# Patient Record
Sex: Male | Born: 1990 | Race: White | Hispanic: No | Marital: Single | Smoking: Current every day smoker
Health system: Southern US, Community
[De-identification: ages and names within clinical notes are randomized; demographics above are authoritative.]

## PROBLEM LIST (undated history)

## (undated) DIAGNOSIS — A770 Spotted fever due to Rickettsia rickettsii: Secondary | ICD-10-CM

## (undated) DIAGNOSIS — F1990 Other psychoactive substance use, unspecified, uncomplicated: Secondary | ICD-10-CM

## (undated) HISTORY — PX: JOINT REPLACEMENT: SHX530

---
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
## 2008-01-21 ENCOUNTER — Emergency Department (HOSPITAL_COMMUNITY): Admission: EM | Admit: 2008-01-21 | Discharge: 2008-01-21 | Payer: Self-pay | Admitting: *Deleted

## 2008-07-19 ENCOUNTER — Emergency Department (HOSPITAL_COMMUNITY): Admission: EM | Admit: 2008-07-19 | Discharge: 2008-07-19 | Payer: Self-pay | Admitting: Emergency Medicine

## 2009-02-02 ENCOUNTER — Emergency Department (HOSPITAL_COMMUNITY): Admission: EM | Admit: 2009-02-02 | Discharge: 2009-02-03 | Payer: Self-pay | Admitting: Emergency Medicine

## 2009-12-13 ENCOUNTER — Emergency Department (HOSPITAL_COMMUNITY): Admission: EM | Admit: 2009-12-13 | Discharge: 2009-12-13 | Payer: Self-pay | Admitting: Emergency Medicine

## 2010-03-14 ENCOUNTER — Emergency Department (HOSPITAL_COMMUNITY): Admission: EM | Admit: 2010-03-14 | Discharge: 2010-03-14 | Payer: Self-pay | Admitting: Emergency Medicine

## 2010-09-06 ENCOUNTER — Emergency Department (HOSPITAL_COMMUNITY)
Admission: EM | Admit: 2010-09-06 | Discharge: 2010-09-06 | Disposition: A | Discharge status: Home / Self Care | Payer: Self-pay | Attending: Emergency Medicine | Admitting: Emergency Medicine

## 2010-09-06 ENCOUNTER — Emergency Department (HOSPITAL_COMMUNITY): Payer: Self-pay

## 2010-09-06 DIAGNOSIS — M7989 Other specified soft tissue disorders: Secondary | ICD-10-CM | POA: Insufficient documentation

## 2010-09-06 DIAGNOSIS — S62319A Displaced fracture of base of unspecified metacarpal bone, initial encounter for closed fracture: Secondary | ICD-10-CM | POA: Insufficient documentation

## 2010-09-06 DIAGNOSIS — S6990XA Unspecified injury of unspecified wrist, hand and finger(s), initial encounter: Secondary | ICD-10-CM | POA: Insufficient documentation

## 2010-09-06 DIAGNOSIS — IMO0002 Reserved for concepts with insufficient information to code with codable children: Secondary | ICD-10-CM | POA: Insufficient documentation

## 2010-09-06 DIAGNOSIS — W2209XA Striking against other stationary object, initial encounter: Secondary | ICD-10-CM | POA: Insufficient documentation

## 2010-09-06 DIAGNOSIS — M79609 Pain in unspecified limb: Secondary | ICD-10-CM | POA: Insufficient documentation

## 2010-09-06 LAB — CBC
HCT: 43.3 % (ref 39.0–52.0)
MCHC: 35.8 g/dL (ref 30.0–36.0)
RBC: 5.09 MIL/uL (ref 4.22–5.81)
RDW: 12.3 % (ref 11.5–15.5)
WBC: 9 10*3/uL (ref 4.0–10.5)

## 2010-09-06 LAB — BASIC METABOLIC PANEL
BUN: 8 mg/dL (ref 6–23)
Chloride: 106 mEq/L (ref 96–112)
GFR calc Af Amer: >60 mL/min (ref >60)
Sodium: 138 mEq/L (ref 135–145)

## 2010-09-06 LAB — DIFFERENTIAL
Eosinophils Absolute: 0.2 10*3/uL (ref 0.0–0.7)
Eosinophils Relative: 2 % (ref 0–5)
Lymphocytes Relative: 27 % (ref 12–46)
Lymphs Abs: 2.4 10*3/uL (ref 0.7–4.0)
Monocytes Relative: 6 % (ref 3–12)
Neutrophils Relative %: 65 % (ref 43–77)

## 2010-09-12 LAB — HERPES SIMPLEX VIRUS CULTURE

## 2010-09-15 LAB — BASIC METABOLIC PANEL
BUN: 6 mg/dL (ref 6–23)
Calcium: 9.4 mg/dL (ref 8.4–10.5)
Creatinine, Ser: 0.97 mg/dL (ref 0.4–1.5)
Sodium: 140 mEq/L (ref 135–145)

## 2010-09-15 LAB — RAPID URINE DRUG SCREEN, HOSP PERFORMED
Amphetamines: NOT DETECTED
Opiates: NOT DETECTED

## 2010-09-15 LAB — ETHANOL: Alcohol, Ethyl (B): 124 mg/dL — ABNORMAL HIGH (ref 0–10)

## 2010-09-15 LAB — POCT CARDIAC MARKERS
Myoglobin, poc: 134 ng/mL (ref 12–200)
Troponin i, poc: <0.05 ng/mL (ref 0.00–0.09)

## 2010-09-15 LAB — DIFFERENTIAL
Basophils Relative: 1 % (ref 0–1)
Eosinophils Absolute: 0.1 10*3/uL (ref 0.0–0.7)
Eosinophils Relative: 2 % (ref 0–5)
Lymphs Abs: 2.6 10*3/uL (ref 0.7–4.0)
Monocytes Absolute: 0.5 10*3/uL (ref 0.1–1.0)
Monocytes Relative: 7 % (ref 3–12)
Neutro Abs: 3.9 10*3/uL (ref 1.7–7.7)

## 2010-09-15 LAB — CBC
HCT: 45.9 % (ref 39.0–52.0)
MCHC: 34.8 g/dL (ref 30.0–36.0)
MCV: 93.4 fL (ref 78.0–100.0)
Platelets: 202 10*3/uL (ref 150–400)
RBC: 4.91 MIL/uL (ref 4.22–5.81)

## 2010-11-15 NOTE — Op Note (Signed)
NAME:  Gabriel Boyd, Gabriel Boyd                          ACCOUNT NO.:  [id number]   MEDICAL RECORD NO.:  [id number]                   PATIENT TYPE:  EMS   LOCATION:  MINO                                 FACILITY:  [facility]   PHYSICIAN:  Harvie Junior, M.D.                DATE OF BIRTH:  10-28-1990   DATE OF PROCEDURE:  02/11/2002  DATE OF DISCHARGE:  02/11/2002                                 OPERATIVE REPORT   PREOPERATIVE DIAGNOSIS:  Laceration, left Achilles tendon, status post  repair with failure.   POSTOPERATIVE DIAGNOSIS:  Laceration, left Achilles tendon, status post  repair with failure.   PROCEDURES:  1. Irrigation and debridement of Achilles tendon laceration.  2. Repair of Achilles tendon.   SURGEON:  Harvie Junior, M.D.   ASSISTANT:  Currie Paris. Thedore Mins.   ANESTHESIA:  General.   BRIEF HISTORY:  A 20 year old male with a long history of having had a cut  to his Achilles tendon.  He was seen at an outside facility where it was  irrigated, washed, and closed.  He ultimately was walking and heard a big  pop.  He came in for evaluation.  At that time he was noted to have an  obvious discontinuity of his Achilles tendon, and he was brought to the  operating room for evaluation and fixation as needed.   DESCRIPTION OF PROCEDURE:  The patient was brought to the operating room,  where after adequate anesthesia was obtained with a general anesthetic, the  patient was positioned upon the operating table.  He was rolled to the prone  position, and all bony prominences were well-padded.  The left leg was  prepped and draped in the usual sterile fashion.  Following this, routine  examination of the left heel revealed that there was an obvious complete  tear of the Achilles tendon with some mild injury into the calcaneus.  At  this point the portion of the calcaneus which had been injured was  rongeured.  The wound was copiously irrigated with 6 L of normal saline  irrigation.   Then bug juice was instilled into the wound for cleansing.  At  this point the Achilles tendon was identified and repaired primarily.  This  was done with #2 Fibrewire sutures.  An excellent repair of the tendon was  achieved.  At that point the wound was closed loosely after debridement of  all contamination in the wound.  The wound was closed loosely, and a drain  was placed.  The patient was then taken to the recovery room, where he was  noted to be in satisfactory condition in a posterior padded splint with his  toes in a downward position.  Harvie Junior, M.D.    Ranae Plumber  D:  03/31/2002  T:  03/31/2002  Job:  [id number]

## 2010-12-09 ENCOUNTER — Emergency Department (HOSPITAL_COMMUNITY)
Admission: EM | Admit: 2010-12-09 | Discharge: 2010-12-10 | Disposition: A | Discharge status: Other Acute Inpatient Hospital | Payer: Self-pay | Attending: Emergency Medicine | Admitting: Emergency Medicine

## 2010-12-09 DIAGNOSIS — S41109A Unspecified open wound of unspecified upper arm, initial encounter: Secondary | ICD-10-CM | POA: Insufficient documentation

## 2010-12-09 DIAGNOSIS — F329 Major depressive disorder, single episode, unspecified: Secondary | ICD-10-CM | POA: Insufficient documentation

## 2010-12-09 DIAGNOSIS — X789XXA Intentional self-harm by unspecified sharp object, initial encounter: Secondary | ICD-10-CM | POA: Insufficient documentation

## 2010-12-09 DIAGNOSIS — F1010 Alcohol abuse, uncomplicated: Secondary | ICD-10-CM | POA: Insufficient documentation

## 2010-12-09 DIAGNOSIS — F3289 Other specified depressive episodes: Secondary | ICD-10-CM | POA: Insufficient documentation

## 2010-12-09 LAB — POCT I-STAT, CHEM 8
BUN: 9 mg/dL (ref 6–23)
Chloride: 103 mEq/L (ref 96–112)
Creatinine, Ser: 1.5 mg/dL (ref 0.4–1.5)
Hemoglobin: 15.3 g/dL (ref 13.0–17.0)
Potassium: 4.9 mEq/L (ref 3.5–5.1)
TCO2: 21 mmol/L (ref 0–100)

## 2010-12-09 LAB — TYPE AND SCREEN: ABO/RH(D): A POS

## 2010-12-10 ENCOUNTER — Inpatient Hospital Stay (HOSPITAL_COMMUNITY)
Admission: AD | Admit: 2010-12-10 | Discharge: 2010-12-11 | DRG: 897 | Disposition: A | Discharge status: Home / Self Care | Payer: PRIVATE HEALTH INSURANCE | Attending: Psychiatry | Admitting: Psychiatry

## 2010-12-10 DIAGNOSIS — F1994 Other psychoactive substance use, unspecified with psychoactive substance-induced mood disorder: Secondary | ICD-10-CM

## 2010-12-10 DIAGNOSIS — F1410 Cocaine abuse, uncomplicated: Secondary | ICD-10-CM

## 2010-12-10 DIAGNOSIS — F39 Unspecified mood [affective] disorder: Secondary | ICD-10-CM

## 2010-12-10 DIAGNOSIS — F411 Generalized anxiety disorder: Secondary | ICD-10-CM

## 2010-12-10 DIAGNOSIS — Z818 Family history of other mental and behavioral disorders: Secondary | ICD-10-CM

## 2010-12-10 DIAGNOSIS — F1920 Other psychoactive substance dependence, uncomplicated: Secondary | ICD-10-CM

## 2010-12-10 DIAGNOSIS — S41109A Unspecified open wound of unspecified upper arm, initial encounter: Secondary | ICD-10-CM

## 2010-12-10 DIAGNOSIS — X838XXA Intentional self-harm by other specified means, initial encounter: Secondary | ICD-10-CM

## 2010-12-10 DIAGNOSIS — Z560 Unemployment, unspecified: Secondary | ICD-10-CM

## 2010-12-10 DIAGNOSIS — F19982 Other psychoactive substance use, unspecified with psychoactive substance-induced sleep disorder: Secondary | ICD-10-CM

## 2010-12-10 DIAGNOSIS — F1210 Cannabis abuse, uncomplicated: Secondary | ICD-10-CM

## 2010-12-10 DIAGNOSIS — F1010 Alcohol abuse, uncomplicated: Principal | ICD-10-CM

## 2010-12-10 LAB — URINALYSIS, ROUTINE W REFLEX MICROSCOPIC
Hgb urine dipstick: NEGATIVE
Leukocytes, UA: NEGATIVE
Protein, ur: NEGATIVE mg/dL

## 2010-12-10 LAB — RAPID URINE DRUG SCREEN, HOSP PERFORMED
Barbiturates: NOT DETECTED
Tetrahydrocannabinol: POSITIVE — AB

## 2010-12-11 LAB — URINE CULTURE
Colony Count: NO GROWTH
Culture: NO GROWTH

## 2010-12-12 ENCOUNTER — Inpatient Hospital Stay (INDEPENDENT_AMBULATORY_CARE_PROVIDER_SITE_OTHER)
Admission: RE | Admit: 2010-12-12 | Discharge: 2010-12-12 | Disposition: A | Discharge status: Home / Self Care | Payer: PRIVATE HEALTH INSURANCE | Source: Ambulatory Visit | Attending: Family Medicine | Admitting: Family Medicine

## 2010-12-12 DIAGNOSIS — T148XXA Other injury of unspecified body region, initial encounter: Secondary | ICD-10-CM
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
## 2012-02-03 ENCOUNTER — Encounter (HOSPITAL_COMMUNITY): Payer: Self-pay | Admitting: *Deleted

## 2012-02-03 ENCOUNTER — Emergency Department (HOSPITAL_COMMUNITY)
Admission: EM | Admit: 2012-02-03 | Discharge: 2012-02-04 | Disposition: A | Discharge status: Home / Self Care | Payer: Self-pay | Attending: Emergency Medicine | Admitting: Emergency Medicine

## 2012-02-03 DIAGNOSIS — A770 Spotted fever due to Rickettsia rickettsii: Secondary | ICD-10-CM

## 2012-02-03 DIAGNOSIS — F17200 Nicotine dependence, unspecified, uncomplicated: Secondary | ICD-10-CM | POA: Insufficient documentation

## 2012-02-03 DIAGNOSIS — A779 Spotted fever, unspecified: Secondary | ICD-10-CM | POA: Insufficient documentation

## 2012-02-03 DIAGNOSIS — R509 Fever, unspecified: Secondary | ICD-10-CM

## 2012-02-03 MED ORDER — ACETAMINOPHEN 325 MG PO TABS
650.0000 mg | ORAL_TABLET | Freq: Once | ORAL | Status: AC
  Administered 2012-02-03: 650 mg via ORAL
  Filled 2012-02-03: qty 2

## 2012-02-03 MED ORDER — ONDANSETRON HCL 4 MG/2ML IJ SOLN
4.0000 mg | Freq: Once | INTRAMUSCULAR | Status: AC
  Administered 2012-02-04: 4 mg via INTRAVENOUS
  Filled 2012-02-03: qty 2

## 2012-02-03 MED ORDER — SODIUM CHLORIDE 0.9 % IV SOLN
1000.0000 mL | INTRAVENOUS | Status: DC
  Administered 2012-02-04: 1000 mL via INTRAVENOUS

## 2012-02-03 MED ORDER — SODIUM CHLORIDE 0.9 % IV SOLN
1000.0000 mL | Freq: Once | INTRAVENOUS | Status: AC
  Administered 2012-02-04: 1000 mL via INTRAVENOUS

## 2012-02-03 MED ORDER — DOXYCYCLINE HYCLATE 100 MG PO TABS
100.0000 mg | ORAL_TABLET | Freq: Once | ORAL | Status: AC
  Administered 2012-02-04: 100 mg via ORAL
  Filled 2012-02-03: qty 1

## 2012-02-03 NOTE — ED Notes (Signed)
Pt states he will not allow NT to recheck vitals unless it will get pt back to room faster. Refused vital check when pt was informed on wait.

## 2012-02-03 NOTE — ED Notes (Signed)
To ED for eval of decreased appetite, fever, tingling in legs for the past week. Pt took otc meds at home without relief. Also complains of a migraine

## 2012-02-03 NOTE — ED Notes (Signed)
Pt girlfriend states that pt has had problems with his teeth and she thinks that he may have an infection in his teeth as well

## 2012-02-04 ENCOUNTER — Emergency Department (HOSPITAL_COMMUNITY): Payer: Self-pay

## 2012-02-04 LAB — URINALYSIS, ROUTINE W REFLEX MICROSCOPIC
Bilirubin Urine: NEGATIVE
Hgb urine dipstick: NEGATIVE
Ketones, ur: NEGATIVE mg/dL
Protein, ur: NEGATIVE mg/dL
Specific Gravity, Urine: 1.015 (ref 1.005–1.030)
Urobilinogen, UA: 0.2 mg/dL (ref 0.0–1.0)

## 2012-02-04 LAB — CBC WITH DIFFERENTIAL/PLATELET
Basophils Absolute: 0 10*3/uL (ref 0.0–0.1)
HCT: 38.2 % — ABNORMAL LOW (ref 39.0–52.0)
Lymphocytes Relative: 19 % (ref 12–46)
Lymphs Abs: 1.3 10*3/uL (ref 0.7–4.0)
Neutro Abs: 4.9 10*3/uL (ref 1.7–7.7)
Platelets: 183 10*3/uL (ref 150–400)
RBC: 4.4 MIL/uL (ref 4.22–5.81)
RDW: 12.6 % (ref 11.5–15.5)
WBC: 7 10*3/uL (ref 4.0–10.5)

## 2012-02-04 LAB — COMPREHENSIVE METABOLIC PANEL
ALT: 76 U/L — ABNORMAL HIGH (ref 0–53)
AST: 28 U/L (ref 0–37)
Alkaline Phosphatase: 125 U/L — ABNORMAL HIGH (ref 39–117)
CO2: 27 mEq/L (ref 19–32)
Chloride: 97 mEq/L (ref 96–112)
GFR calc non Af Amer: >90 mL/min (ref >90)
Sodium: 134 mEq/L — ABNORMAL LOW (ref 135–145)
Total Bilirubin: 0.6 mg/dL (ref 0.3–1.2)

## 2012-02-04 LAB — URINE CULTURE

## 2012-02-04 MED ORDER — OXYCODONE-ACETAMINOPHEN 5-325 MG PO TABS
2.0000 | ORAL_TABLET | Freq: Once | ORAL | Status: AC
  Administered 2012-02-04: 2 via ORAL
  Filled 2012-02-04: qty 2

## 2012-02-04 MED ORDER — OXYCODONE-ACETAMINOPHEN 5-325 MG PO TABS: 2.0000 | ORAL_TABLET | ORAL | Status: AC | PRN

## 2012-02-04 MED ORDER — ONDANSETRON HCL 4 MG PO TABS: 4.0000 mg | ORAL_TABLET | Freq: Four times a day (QID) | ORAL | Status: AC

## 2012-02-04 MED ORDER — IBUPROFEN 800 MG PO TABS: 800.0000 mg | ORAL_TABLET | Freq: Three times a day (TID) | ORAL | Status: AC

## 2012-02-04 MED ORDER — DOXYCYCLINE HYCLATE 100 MG PO CAPS: 100.0000 mg | ORAL_CAPSULE | Freq: Two times a day (BID) | ORAL | Status: AC

## 2012-02-04 MED ORDER — MORPHINE SULFATE 4 MG/ML IJ SOLN
4.0000 mg | Freq: Once | INTRAMUSCULAR | Status: AC
  Administered 2012-02-04: 4 mg via INTRAVENOUS
  Filled 2012-02-04: qty 1

## 2012-02-04 MED ORDER — KETOROLAC TROMETHAMINE 30 MG/ML IJ SOLN
30.0000 mg | Freq: Once | INTRAMUSCULAR | Status: AC
  Administered 2012-02-04: 30 mg via INTRAVENOUS
  Filled 2012-02-04: qty 1

## 2012-02-04 NOTE — ED Notes (Signed)
Transported to xray dept via stretcher per xray tech  

## 2012-02-04 NOTE — ED Provider Notes (Signed)
History     CSN: [id number]  Arrival date & time 02/03/12  1821   First MD Initiated Contact with Patient 02/03/12 2303      Chief Complaint  Patient presents with  . Fever  . Anorexia    (Consider location/radiation/quality/duration/timing/severity/associated sxs/prior treatment) HPI 21 year old male presents to emergency department with complaint of headache, fever, rash, anorexia, occasional vomiting and overall malaise. Patient reports symptoms have been ongoing for about a week. MAXIMUM TEMPERATURE has been 103.3. Patient developed rash this past Sunday. Patient thought it was due to his "bad teeth". He denies any tick bites, [place]. He denies any sick contacts, immunizations are up-to-date. He has had no diarrhea no sore throat no ear pain. He has had occasional photophobia. He denies any neck stiffness or tenderness.  History reviewed. No pertinent past medical history.  History reviewed. No pertinent past surgical history.  History reviewed. No pertinent family history.  History  Substance Use Topics  . Smoking status: Current Everyday Smoker    Types: Cigarettes  . Smokeless tobacco: Not on file  . Alcohol Use: Yes     sometimes      Review of Systems  All other systems reviewed and are negative.    Allergies  Review of patient's allergies indicates no known allergies.  Home Medications   Current Outpatient Rx  Name Route Sig Dispense Refill  . IBUPROFEN 200 MG PO TABS Oral Take 800 mg by mouth every 6 (six) hours as needed. For pain    . NAPROXEN SODIUM 220 MG PO TABS Oral Take 440 mg by mouth 2 (two) times daily as needed. For pain    . DOXYCYCLINE HYCLATE 100 MG PO CAPS Oral Take 1 capsule (100 mg total) by mouth 2 (two) times daily. 28 capsule 0  . IBUPROFEN 800 MG PO TABS Oral Take 1 tablet (800 mg total) by mouth 3 (three) times daily. 21 tablet 0  . ONDANSETRON HCL 4 MG PO TABS Oral Take 1 tablet (4 mg total) by mouth every  6 (six) hours. 12 tablet 0  . OXYCODONE-ACETAMINOPHEN 5-325 MG PO TABS Oral Take 2 tablets by mouth every 4 (four) hours as needed for pain. 20 tablet 0    BP 122/67  Pulse 46  Temp 99.2 F (37.3 C) (Oral)  Resp 18  SpO2 96%  Physical Exam  Nursing note and vitals reviewed. Constitutional: He is oriented to person, place, and time. He appears well-developed and well-nourished. No distress.  HENT:  Head: Normocephalic and atraumatic.  Right Ear: External ear normal.  Left Ear: External ear normal.  Nose: Nose normal.  Mouth/Throat: Oropharynx is clear and moist.  Eyes: EOM are normal. Pupils are equal, round, and reactive to light. No scleral icterus.       Patient with conjunctival injection without exudate  Neck: Normal range of motion. Neck supple. No JVD present. No tracheal deviation present. No thyromegaly present.       No nuchal rigidity normal range of motion  Cardiovascular: Normal rate, regular rhythm, normal heart sounds and intact distal pulses.  Exam reveals no gallop and no friction rub.   No murmur heard. Pulmonary/Chest: Effort normal and breath sounds normal. No stridor. No respiratory distress. He has no wheezes. He has no rales. He exhibits no tenderness.  Abdominal: Soft. Bowel sounds are normal. He exhibits no distension and no mass. There is no tenderness. There is no rebound and no guarding.  Musculoskeletal: Normal range  of motion. He exhibits tenderness (tender over areas of rash). He exhibits no edema.  Lymphadenopathy:    He has no cervical adenopathy.  Neurological: He is alert and oriented to person, place, and time. He has normal reflexes. He displays normal reflexes. No cranial nerve deficit. He exhibits normal muscle tone. Coordination normal.  Skin: Skin is warm and dry. Rash (diffuse erythematous patchy rash over legs, arms, trunk. No rash on palms worse. The. Rash blanches when pressed, rashes tender to palpation) noted. He is not diaphoretic. No  erythema. No pallor.  Psychiatric: He has a normal mood and affect. His behavior is normal. Judgment and thought content normal.    ED Course  Procedures (including critical care time)  Labs Reviewed  CBC WITH DIFFERENTIAL - Abnormal; Notable for the following:    HCT 38.2 (*)     All other components within normal limits  COMPREHENSIVE METABOLIC PANEL - Abnormal; Notable for the following:    Sodium 134 (*)     Potassium 3.4 (*)     Glucose, Bld 135 (*)     Albumin 3.1 (*)     ALT 76 (*)     Alkaline Phosphatase 125 (*)     All other components within normal limits  URINALYSIS, ROUTINE W REFLEX MICROSCOPIC  ROCKY MTN SPOTTED FVR AB, IGM-BLOOD  ROCKY MTN SPOTTED FVR AB, IGG-BLOOD  CULTURE, BLOOD (ROUTINE X 2)  CULTURE, BLOOD (ROUTINE X 2)  URINE CULTURE   Dg Chest 2 View  02/04/2012  *RADIOLOGY REPORT*  Clinical Data: Fever, headache, nausea  CHEST - 2 VIEW  Comparison: 12/13/2009  Findings: Mild central bronchitic changes.  No focal pneumonia, edema, collapse, consolidation, effusion or pneumothorax.  Trachea midline.  Normal heart size and vascularity.  No osseous abnormality.  IMPRESSION: Mild central bronchitic changes.  No focal pneumonia.  Original Report Authenticated By: Judie Petit. Ruel Favors, M.D.     1. Fever   2. [place] spotted fever       MDM  21 year old male with symptoms suspicious of [place] spotted fever. He does not remember at tick bite, however he may of been bitten by a early stage tick and not known. Patient with slight elevation in transaminases, slightly low platelets. Will start on doxycycline, send titers for [facility] spotted fever. Patient to return to the ER if not improving. Precautions given to patient and his girlfriend for reasons for return.        Olivia Mackie, MD 02/04/12 [phone]

## 2012-02-04 NOTE — ED Notes (Signed)
Pt has had water times 2 cups earlier and kept it down and I gave patient another cup of ice water.

## 2012-02-04 NOTE — ED Notes (Signed)
PT has had headache, vomiting, chills, and fever for one week.  PT is resting to the side and states that headache has improved from 10/10 to 7/10.  No abdominal pain

## 2012-02-05 LAB — ROCKY MTN SPOTTED FVR AB, IGG-BLOOD: RMSF IgG: 0.11 IV

## 2012-02-10 LAB — CULTURE, BLOOD (ROUTINE X 2): Culture: NO GROWTH

## 2012-03-21 ENCOUNTER — Emergency Department (HOSPITAL_COMMUNITY): Payer: Self-pay

## 2012-03-21 ENCOUNTER — Emergency Department (HOSPITAL_COMMUNITY)
Admission: EM | Admit: 2012-03-21 | Discharge: 2012-03-21 | Disposition: A | Discharge status: Home / Self Care | Payer: Self-pay | Attending: Emergency Medicine | Admitting: Emergency Medicine

## 2012-03-21 ENCOUNTER — Encounter (HOSPITAL_COMMUNITY): Payer: Self-pay | Admitting: *Deleted

## 2012-03-21 DIAGNOSIS — S60229A Contusion of unspecified hand, initial encounter: Secondary | ICD-10-CM | POA: Insufficient documentation

## 2012-03-21 DIAGNOSIS — S61409A Unspecified open wound of unspecified hand, initial encounter: Secondary | ICD-10-CM | POA: Insufficient documentation

## 2012-03-21 DIAGNOSIS — F17200 Nicotine dependence, unspecified, uncomplicated: Secondary | ICD-10-CM | POA: Insufficient documentation

## 2012-03-21 DIAGNOSIS — S61459A Open bite of unspecified hand, initial encounter: Secondary | ICD-10-CM

## 2012-03-21 MED ORDER — TRAMADOL HCL 50 MG PO TABS: 50.0000 mg | ORAL_TABLET | Freq: Four times a day (QID) | ORAL | Status: DC | PRN

## 2012-03-21 MED ORDER — AMOXICILLIN-POT CLAVULANATE 875-125 MG PO TABS: 1.0000 | ORAL_TABLET | Freq: Two times a day (BID) | ORAL | Status: DC

## 2012-03-21 MED ORDER — AMOXICILLIN-POT CLAVULANATE 875-125 MG PO TABS
1.0000 | ORAL_TABLET | Freq: Once | ORAL | Status: AC
  Administered 2012-03-21: 1 via ORAL
  Filled 2012-03-21: qty 1

## 2012-03-21 NOTE — ED Notes (Signed)
Pt states "he wasn't coming back to a triage room if he wasn't getting a cast put on", pt refused recheck of VS at this time and remains in waiting room, ambulatory.
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
## 2012-03-21 NOTE — ED Notes (Signed)
Pt refused discharged instructions and prescriptions as well as irrigation to the hand wound.

## 2012-03-21 NOTE — ED Notes (Signed)
Pt aware of delay with applying splint, made aware Ortho will be called to come in to apply splint. Pt elects to leave without splint "since it is not broken"

## 2012-03-21 NOTE — ED Notes (Signed)
Pt refused vital signs to be taken.

## 2013-03-09 ENCOUNTER — Encounter (HOSPITAL_COMMUNITY): Payer: Self-pay | Admitting: Emergency Medicine

## 2013-03-09 ENCOUNTER — Observation Stay (HOSPITAL_COMMUNITY)
Admission: EM | Admit: 2013-03-09 | Discharge: 2013-03-09 | Discharge status: Against Medical Advice | Payer: PRIVATE HEALTH INSURANCE | Attending: Emergency Medicine | Admitting: Emergency Medicine

## 2013-03-09 ENCOUNTER — Emergency Department (HOSPITAL_COMMUNITY): Payer: PRIVATE HEALTH INSURANCE

## 2013-03-09 DIAGNOSIS — L02419 Cutaneous abscess of limb, unspecified: Secondary | ICD-10-CM

## 2013-03-09 DIAGNOSIS — F1410 Cocaine abuse, uncomplicated: Secondary | ICD-10-CM | POA: Insufficient documentation

## 2013-03-09 DIAGNOSIS — F1910 Other psychoactive substance abuse, uncomplicated: Secondary | ICD-10-CM

## 2013-03-09 DIAGNOSIS — IMO0002 Reserved for concepts with insufficient information to code with codable children: Principal | ICD-10-CM | POA: Insufficient documentation

## 2013-03-09 HISTORY — DX: Other psychoactive substance use, unspecified, uncomplicated: F19.90

## 2013-03-09 HISTORY — DX: Spotted fever due to Rickettsia rickettsii: A77.0

## 2013-03-09 LAB — CBC WITH DIFFERENTIAL/PLATELET
Basophils Absolute: 0 10*3/uL (ref 0.0–0.1)
HCT: 47.2 % (ref 39.0–52.0)
Lymphocytes Relative: 33 % (ref 12–46)
Monocytes Absolute: 0.6 10*3/uL (ref 0.1–1.0)
Neutro Abs: 5.5 10*3/uL (ref 1.7–7.7)
Platelets: 231 10*3/uL (ref 150–400)
RDW: 12.7 % (ref 11.5–15.5)
WBC: 9.5 10*3/uL (ref 4.0–10.5)

## 2013-03-09 LAB — POCT I-STAT, CHEM 8
Calcium, Ion: 1.2 mmol/L (ref 1.12–1.23)
Chloride: 102 mEq/L (ref 96–112)
Creatinine, Ser: 1.3 mg/dL (ref 0.50–1.35)
Glucose, Bld: 81 mg/dL (ref 70–99)
HCT: 51 % (ref 39.0–52.0)
Potassium: 3.3 mEq/L — ABNORMAL LOW (ref 3.5–5.1)

## 2013-03-09 MED ORDER — SULFAMETHOXAZOLE-TMP DS 800-160 MG PO TABS: 1.0000 | ORAL_TABLET | Freq: Two times a day (BID) | ORAL | Status: DC

## 2013-03-09 MED ORDER — VANCOMYCIN HCL IN DEXTROSE 1-5 GM/200ML-% IV SOLN
1000.0000 mg | Freq: Once | INTRAVENOUS | Status: AC
  Administered 2013-03-09: 1000 mg via INTRAVENOUS
  Filled 2013-03-09: qty 200

## 2013-03-09 MED ORDER — IOHEXOL 300 MG/ML  SOLN
80.0000 mL | Freq: Once | INTRAMUSCULAR | Status: AC | PRN
  Administered 2013-03-09: 80 mL via INTRAVENOUS

## 2013-03-09 MED ORDER — SODIUM CHLORIDE 0.9 % IV SOLN
Freq: Once | INTRAVENOUS | Status: AC
  Administered 2013-03-09: 03:00:00 via INTRAVENOUS
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
## 2013-03-09 NOTE — ED Notes (Signed)
Pt has decided to leave AMA, pt did not want to be admitted. Pt aware of risks of leaving AMA. Pt agreed to stay while his antibiotic finishes. Bed request and nurse assigned to receive pt informed.

## 2013-03-09 NOTE — ED Provider Notes (Signed)
Medical screening examination/treatment/procedure(s) were performed by non-physician practitioner and as supervising physician I was immediately available for consultation/collaboration.  Shamera Yarberry M Evolett Somarriba, MD 03/09/13 0459 
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
## 2013-03-09 NOTE — ED Notes (Signed)
Pt c/o abscess at right ac.  St's he used IV drugs 4 days ago and area started swelling with pain 2 days ago.

## 2013-03-09 NOTE — ED Notes (Signed)
Pt reported right arm burning at infusion site, pt assessed, pt's arm red. Vancomycin infusion stopped, NS fluids running wide open. Pharmacy called, pharmacy stated they did not believe it was an allergic reaction rather an infusion related issue. NP informed, pt's infusion rate changed to 100 ml/hr.

## 2013-03-11 LAB — WOUND CULTURE: Special Requests: NORMAL

## 2013-03-15 LAB — CULTURE, BLOOD (ROUTINE X 2): Culture: NO GROWTH

## 2013-03-15 NOTE — ED Provider Notes (Signed)
Medical screening examination/treatment/procedure(s) were performed by non-physician practitioner and as supervising physician I was immediately available for consultation/collaboration.  Analilia Geddis M Aquarius Latouche, MD 03/15/13 1027 

## 2013-08-07 ENCOUNTER — Encounter (HOSPITAL_BASED_OUTPATIENT_CLINIC_OR_DEPARTMENT_OTHER): Payer: Self-pay | Admitting: Emergency Medicine

## 2013-08-07 ENCOUNTER — Emergency Department (HOSPITAL_BASED_OUTPATIENT_CLINIC_OR_DEPARTMENT_OTHER): Payer: Self-pay

## 2013-08-07 DIAGNOSIS — S0003XA Contusion of scalp, initial encounter: Secondary | ICD-10-CM | POA: Insufficient documentation

## 2013-08-07 DIAGNOSIS — Z8619 Personal history of other infectious and parasitic diseases: Secondary | ICD-10-CM | POA: Insufficient documentation

## 2013-08-07 DIAGNOSIS — S0083XA Contusion of other part of head, initial encounter: Principal | ICD-10-CM

## 2013-08-07 DIAGNOSIS — S60229A Contusion of unspecified hand, initial encounter: Secondary | ICD-10-CM | POA: Insufficient documentation

## 2013-08-07 DIAGNOSIS — S1093XA Contusion of unspecified part of neck, initial encounter: Principal | ICD-10-CM

## 2013-08-07 DIAGNOSIS — Y9389 Activity, other specified: Secondary | ICD-10-CM | POA: Insufficient documentation

## 2013-08-07 DIAGNOSIS — F17200 Nicotine dependence, unspecified, uncomplicated: Secondary | ICD-10-CM | POA: Insufficient documentation

## 2013-08-07 DIAGNOSIS — Y9229 Other specified public building as the place of occurrence of the external cause: Secondary | ICD-10-CM | POA: Insufficient documentation
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
## 2013-08-08 ENCOUNTER — Emergency Department (HOSPITAL_BASED_OUTPATIENT_CLINIC_OR_DEPARTMENT_OTHER): Payer: Self-pay

## 2013-08-08 ENCOUNTER — Emergency Department (HOSPITAL_BASED_OUTPATIENT_CLINIC_OR_DEPARTMENT_OTHER)
Admission: EM | Admit: 2013-08-08 | Discharge: 2013-08-08 | Disposition: A | Discharge status: Home / Self Care | Payer: PRIVATE HEALTH INSURANCE | Attending: Emergency Medicine | Admitting: Emergency Medicine

## 2013-08-08 DIAGNOSIS — S0033XA Contusion of nose, initial encounter: Secondary | ICD-10-CM

## 2013-08-08 DIAGNOSIS — S60221A Contusion of right hand, initial encounter: Secondary | ICD-10-CM

## 2013-08-08 MED ORDER — HYDROCODONE-ACETAMINOPHEN 5-325 MG PO TABS: 2.0000 | ORAL_TABLET | ORAL | Status: DC | PRN

## 2013-08-08 MED ORDER — NAPROXEN 500 MG PO TABS: 500.0000 mg | ORAL_TABLET | Freq: Two times a day (BID) | ORAL | Status: DC

## 2013-08-08 NOTE — ED Provider Notes (Signed)
Medical screening examination/treatment/procedure(s) were performed by non-physician practitioner and as supervising physician I was immediately available for consultation/collaboration.      Klaira Pesci L Abhinav Mayorquin, MD 08/08/13 0427 
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
## 2013-08-08 NOTE — Discharge Instructions (Signed)
Your x-ray show no broken bones in your hand or your nose. You will need to apply ice to the areas, elevate your hand and wear the ace wrap for comfort. Follow up with your doctor or return here as needed for problems. Return immediately for worsening symptoms such as numbness of the hand, change in color or other problems.

## 2014-07-20 IMAGING — CR DG CHEST 2V
2 series · 2 of 2 positions shown · non-contrast
Comparison: 12/13/2009

CLINICAL DATA: Fever, headache, nausea

CHEST - 2 VIEW

[w chest pa]
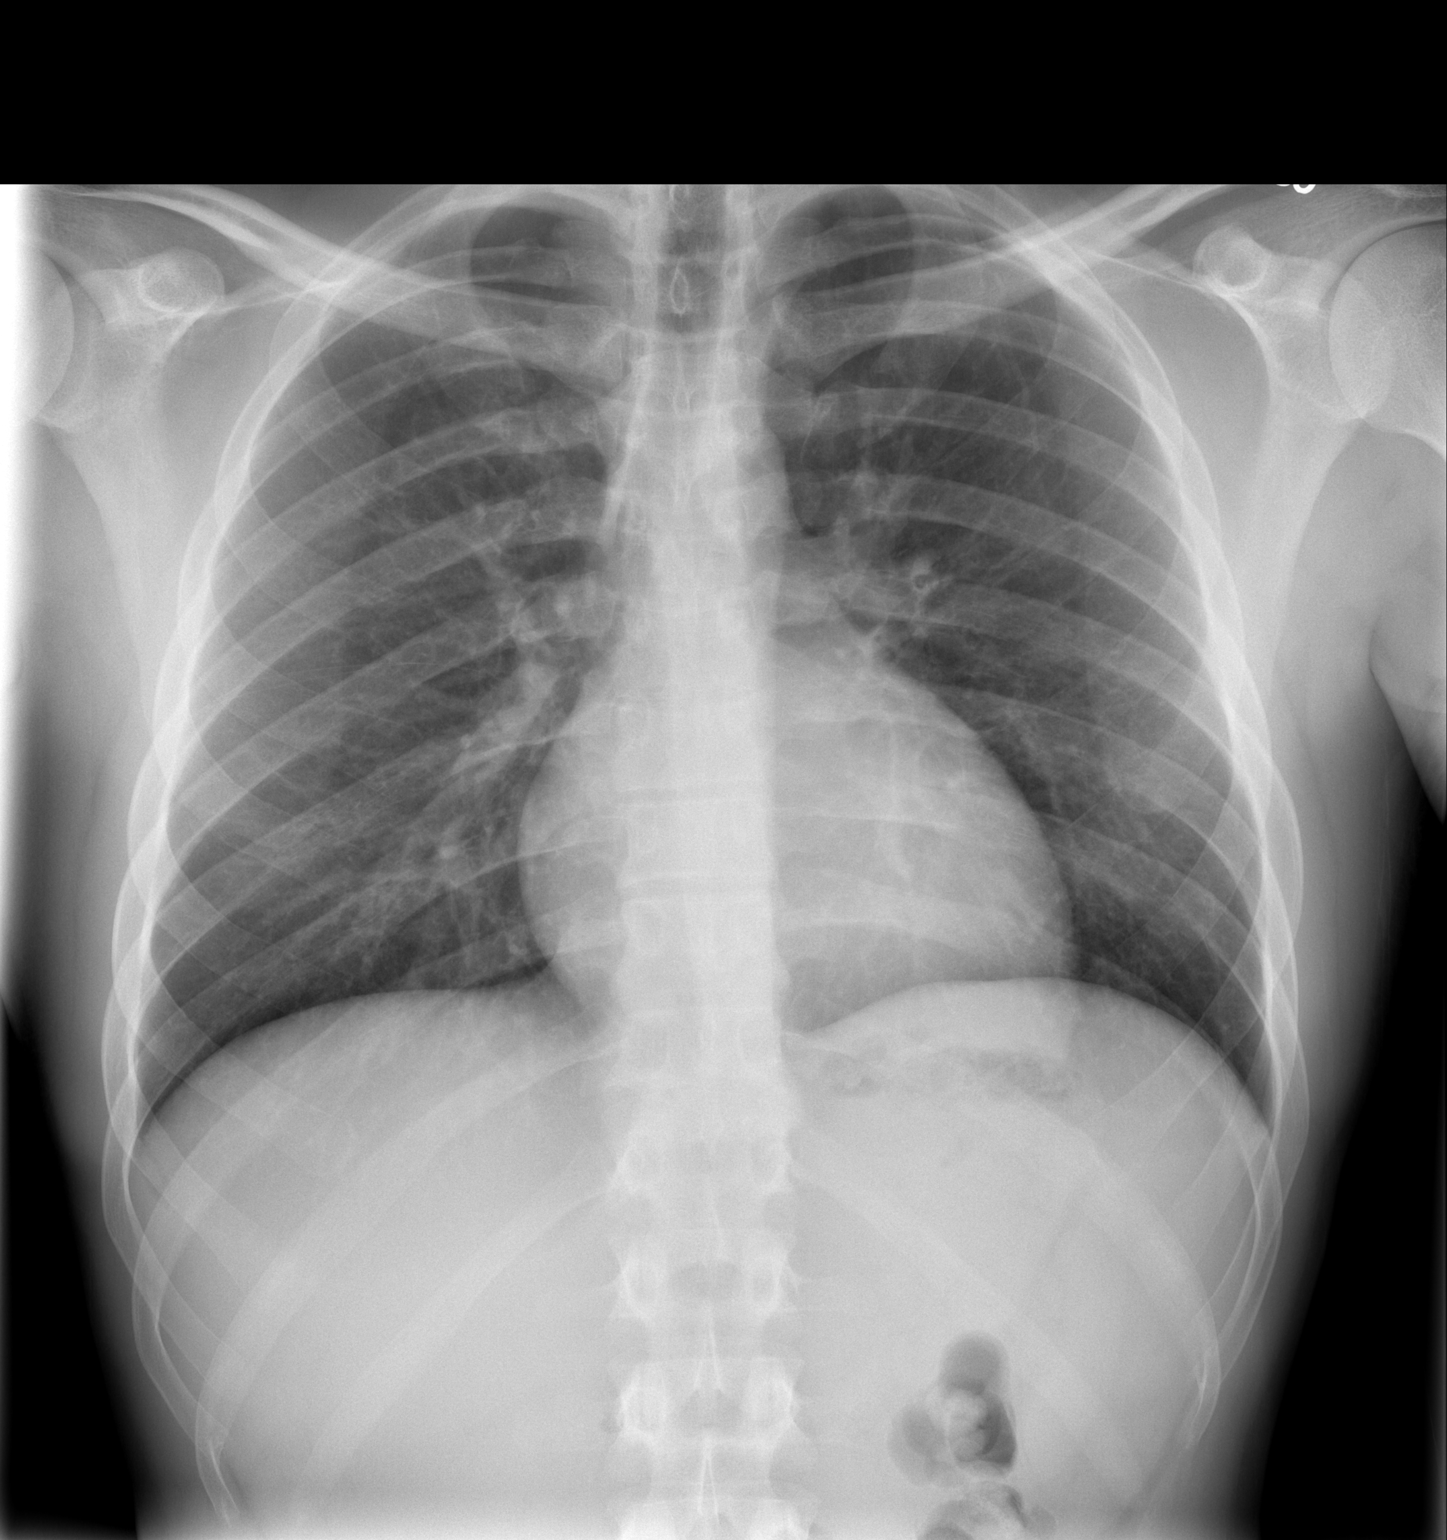

[w chest lat]
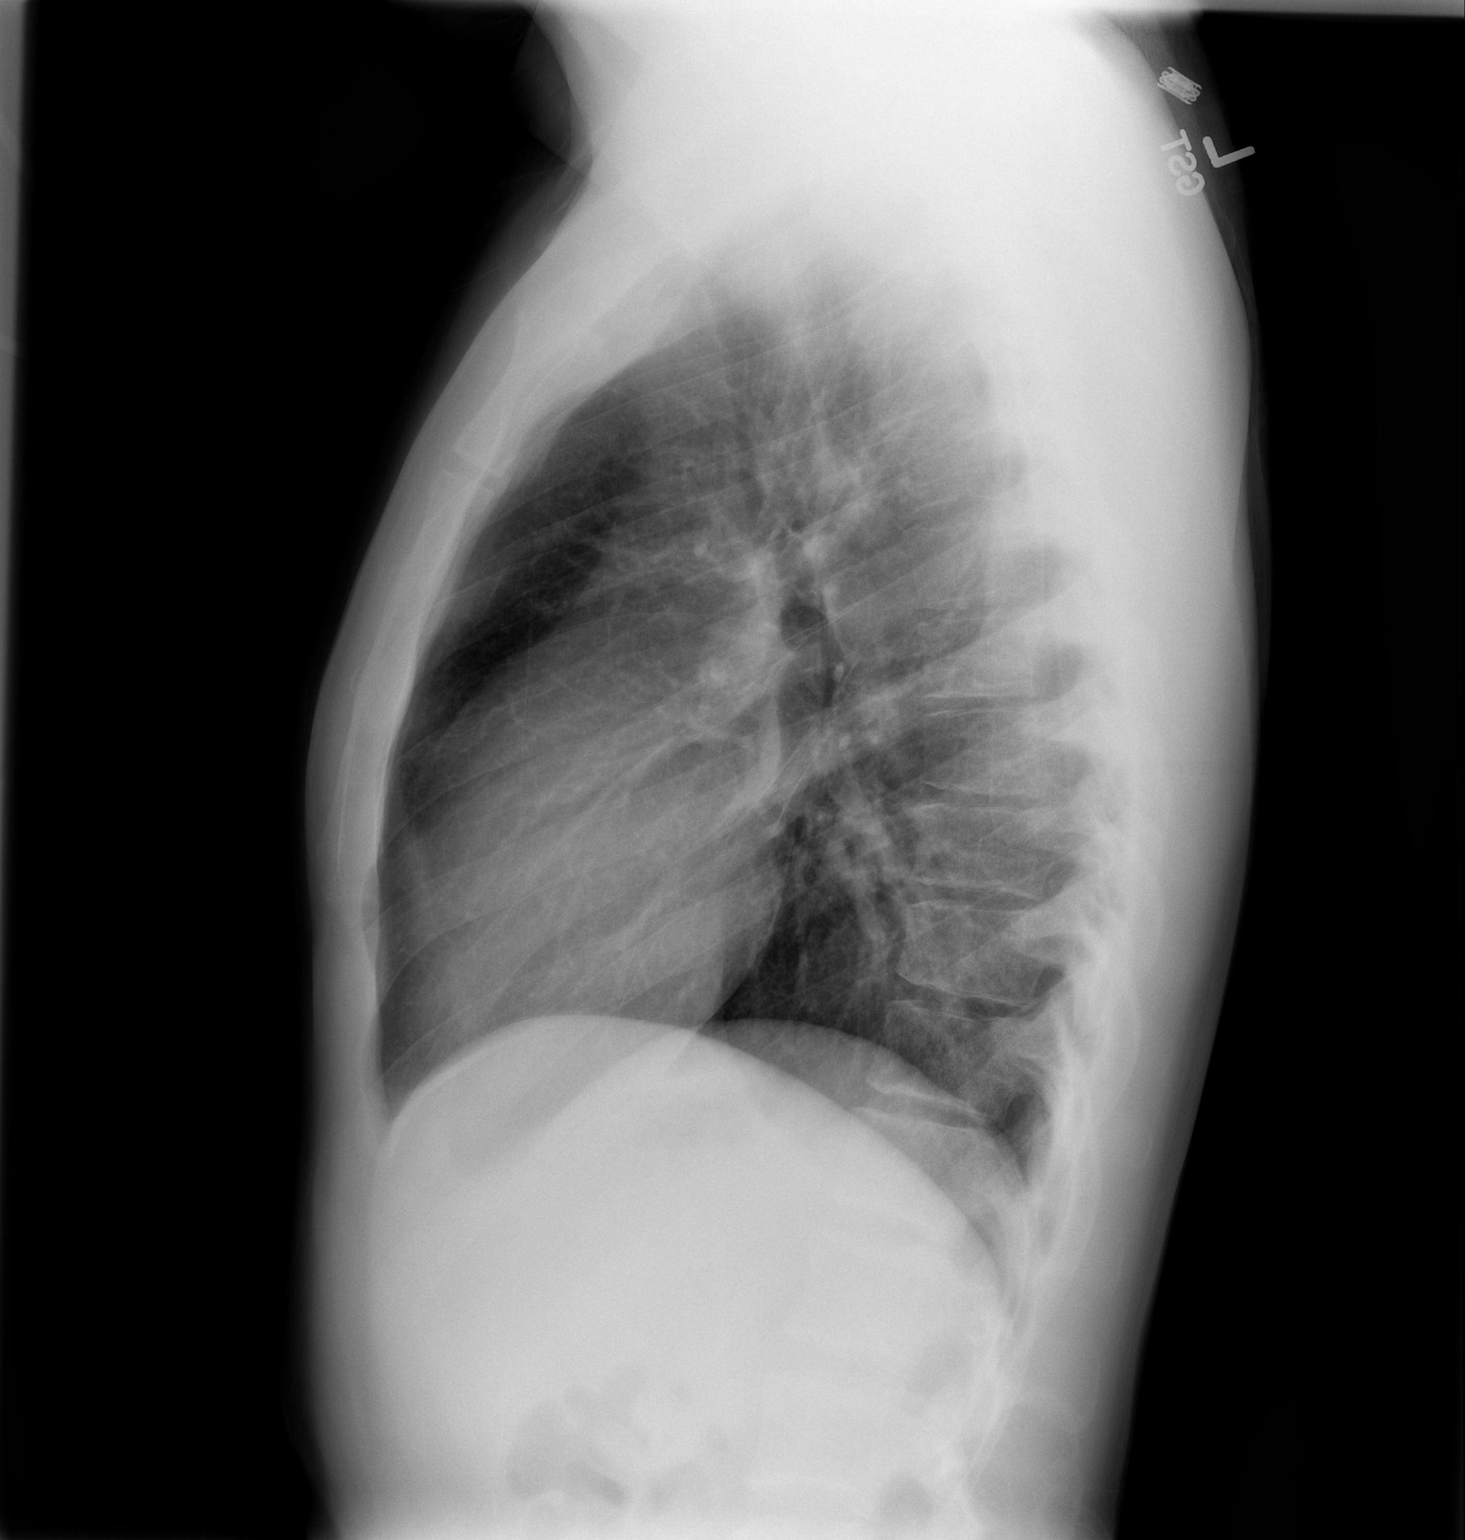

[2 of 2 positions shown; findings below may reference images not displayed]

FINDINGS: Mild central bronchitic changes.  No focal pneumonia,
edema, collapse, consolidation, effusion or pneumothorax.  Trachea
midline.  Normal heart size and vascularity.  No osseous
abnormality.
IMPRESSION: Mild central bronchitic changes.  No focal pneumonia.

## 2015-06-09 ENCOUNTER — Emergency Department: Payer: PRIVATE HEALTH INSURANCE

## 2015-06-09 ENCOUNTER — Emergency Department
Admission: EM | Admit: 2015-06-09 | Discharge: 2015-06-10 | Disposition: A | Discharge status: Home / Self Care | Payer: PRIVATE HEALTH INSURANCE | Attending: Emergency Medicine | Admitting: Emergency Medicine

## 2015-06-09 DIAGNOSIS — Z792 Long term (current) use of antibiotics: Secondary | ICD-10-CM | POA: Insufficient documentation

## 2015-06-09 DIAGNOSIS — S62326A Displaced fracture of shaft of fifth metacarpal bone, right hand, initial encounter for closed fracture: Secondary | ICD-10-CM | POA: Insufficient documentation

## 2015-06-09 DIAGNOSIS — S62306A Unspecified fracture of fifth metacarpal bone, right hand, initial encounter for closed fracture: Secondary | ICD-10-CM

## 2015-06-09 DIAGNOSIS — F17210 Nicotine dependence, cigarettes, uncomplicated: Secondary | ICD-10-CM | POA: Insufficient documentation

## 2015-06-09 DIAGNOSIS — Y9389 Activity, other specified: Secondary | ICD-10-CM | POA: Insufficient documentation

## 2015-06-09 DIAGNOSIS — Z791 Long term (current) use of non-steroidal anti-inflammatories (NSAID): Secondary | ICD-10-CM | POA: Insufficient documentation

## 2015-06-09 DIAGNOSIS — Y998 Other external cause status: Secondary | ICD-10-CM | POA: Insufficient documentation

## 2015-06-09 DIAGNOSIS — Y9289 Other specified places as the place of occurrence of the external cause: Secondary | ICD-10-CM | POA: Insufficient documentation
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
## 2015-06-10 MED ORDER — HYDROCODONE-ACETAMINOPHEN 5-325 MG PO TABS: 1.0000 | ORAL_TABLET | ORAL | Status: DC | PRN

## 2015-06-10 MED ORDER — HYDROCODONE-ACETAMINOPHEN 5-325 MG PO TABS
2.0000 | ORAL_TABLET | Freq: Once | ORAL | Status: AC
  Administered 2015-06-10: 2 via ORAL
  Filled 2015-06-10: qty 2

## 2015-06-10 NOTE — Discharge Instructions (Signed)
As we discussed, you have a broken bone in your right hand (the fifth metacarpal).  The treatment for this is immobilization and a hard splint, orthopedic follow-up in one week, and then likely a hard cast for a couple of months.  Due to your work restrictions, you explained very clearly that you will cut off any splint we put on you tonight.  Since you will not leave a hard splint in place, we gave you a Velcro splint that we ask you use as much as possible, but remember that this is not a good substitute for the type of splint we wanted to give you.  Please read through the information about wound care treatment including icing, elevation, and over-the-counter pain medicine.  Gave you a short course of stronger pain medicine but please only use it if absolutely necessary.  Take Norco as prescribed for severe pain. Do not drink alcohol, drive or participate in any other potentially dangerous activities while taking this medication as it may make you sleepy. Do not take this medication with any other sedating medications, either prescription or over-the-counter. If you were prescribed Percocet or Vicodin, do not take these with acetaminophen (Tylenol) as it is already contained within these medications.   This medication is an opiate (or narcotic) pain medication and can be habit forming.  Use it as little as possible to achieve adequate pain control.  Do not use or use it with extreme caution if you have a history of opiate abuse or dependence.  If you are on a pain contract with your primary care doctor or a pain specialist, be sure to let them know you were prescribed this medication today from [facility].  This medication is intended for your use only - do not give any to anyone else and keep it in a secure place where nobody else, especially children, have access to it.  It will also cause or worsen constipation, so you may want to consider taking an over-the-counter stool  softener while you are taking this medication.   Metacarpal Fracture A metacarpal fracture is a break (fracture) of a bone in the hand. Metacarpals are the bones that extend from your knuckles to your wrist. In each hand, you have five metacarpal bones that connect your fingers and your thumb to your wrist. Some hand fractures have bone pieces that are close together and stable (simple). These fractures may be treated with only a splint or cast. Hand fractures that have many pieces of broken bone (comminuted), unstable bone pieces (displaced), or a bone that breaks through the skin (compound) usually require surgery. CAUSES This injury may be caused by:  A fall.  A hard, direct hit to your hand.  An injury that squeezes your knuckle, stretches your finger out of place, or crushes your hand. RISK FACTORS This injury is more likely to occur if:  You play contact sports.  You have certain bone diseases. SYMPTOMS  Symptoms of this type of fracture develop soon after the injury. Symptoms may include:  Swelling.  Pain.  Stiffness.  Increased pain with movement.  Bruising.  Inability to move a finger.  A shortened finger.  A finger knuckle that looks sunken in.  Unusual appearance of the hand or finger (deformity). DIAGNOSIS  This injury may be diagnosed based on your signs and symptoms, especially if you had a recent hand injury. Your health care provider will perform a physical exam. He or she may also order X-rays to  confirm the diagnosis.  TREATMENT  Treatment for this injury depends on the type of fracture you have and how severe it is. Possible treatments include:  Non-reduction. This can be done if the bone does not need to be moved back into place. The fracture can be casted or splinted as it is.   Closed reduction. If your bone is stable and can be moved back into place, you may only need to wear a cast or splint or have buddy taping.  Closed reduction with  internal fixation (CRIF). This is the most common treatment. You may have this procedure if your bone can be moved back into place but needs more support. Wires, pins, or screws may be inserted through your skin to stabilize the fracture.  Open reduction with internal fixation (ORIF). This may be needed if your fracture is severe and unstable. It involves surgery to move your bone back into the right position. Screws, wires, or plates are used to stabilize the fracture. After all procedures, you may need to wear a cast or a splint for several weeks. You will also need to have follow-up X-rays to make sure that the bone is healing well and staying in position. After you no longer need your cast or splint, you may need physical therapy. This will help you to regain full movement and strength in your hand.  HOME CARE INSTRUCTIONS  If You Have a Cast:  Do not stick anything inside the cast to scratch your skin. Doing that increases your risk of infection.  Check the skin around the cast every day. Report any concerns to your health care provider. You may put lotion on dry skin around the edges of the cast. Do not apply lotion to the skin underneath the cast. If You Have a Splint:  Wear it as directed by your health care provider. Remove it only as directed by your health care provider.  Loosen the splint if your fingers become numb and tingle, or if they turn cold and blue. Bathing  Cover the cast or splint with a watertight plastic bag to protect it from water while you take a bath or a shower. Do not let the cast or splint get wet. Managing Pain, Stiffness, and Swelling  If directed, apply ice to the injured area (if you have a splint, not a cast):  Put ice in a plastic bag.  Place a towel between your skin and the bag.  Leave the ice on for 20 minutes, 2-3 times a day.  Move your fingers often to avoid stiffness and to lessen swelling.  Raise the injured area above the level of your  heart while you are sitting or lying down. Driving  Do not drive or operate heavy machinery while taking pain medicine.  Do not drive while wearing a cast or splint on a hand that you use for driving. Activity  Return to your normal activities as directed by your health care provider. Ask your health care provider what activities are safe for you. General Instructions  Do not put pressure on any part of the cast or splint until it is fully hardened. This may take several hours.  Keep the cast or splint clean and dry.  Do not use any tobacco products, including cigarettes, chewing tobacco, or electronic cigarettes. Tobacco can delay bone healing. If you need help quitting, ask your health care provider.  Take medicines only as directed by your health care provider.  Keep all follow-up visits as directed by  your health care provider. This is important. SEEK MEDICAL CARE IF:   Your pain is getting worse.  You have redness, swelling, or pain in the injured area.   You have fluid, blood, or pus coming from under your cast or splint.   You notice a bad smell coming from under your cast or splint.   You have a fever.  SEEK IMMEDIATE MEDICAL CARE IF:   You develop a rash.   You have trouble breathing.   Your skin or nails on your injured hand turn blue or gray even after you loosen your splint.  Your injured hand feels cold or becomes numb even after you loosen your splint.   You develop severe pain under the cast or in your hand.   This information is not intended to replace advice given to you by your health care provider. Make sure you discuss any questions you have with your health care provider.   Document Released: 06/16/2005 Document Revised: 03/07/2015 Document Reviewed: 04/05/2014 Elsevier Interactive Patient Education [employer]

## 2015-06-10 NOTE — ED Notes (Signed)
Pt states "I don't want a hard cast, I have to go to work on Monday and I will just cut it off".  MD made aware of patient's statement at this time.
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
## 2016-01-22 ENCOUNTER — Encounter: Payer: Self-pay | Admitting: Emergency Medicine

## 2016-01-22 ENCOUNTER — Emergency Department: Payer: Self-pay

## 2016-01-22 ENCOUNTER — Emergency Department
Admission: EM | Admit: 2016-01-22 | Discharge: 2016-01-22 | Disposition: A | Discharge status: Home / Self Care | Payer: Self-pay | Attending: Emergency Medicine | Admitting: Emergency Medicine

## 2016-01-22 DIAGNOSIS — F1490 Cocaine use, unspecified, uncomplicated: Secondary | ICD-10-CM | POA: Insufficient documentation

## 2016-01-22 DIAGNOSIS — Y929 Unspecified place or not applicable: Secondary | ICD-10-CM | POA: Insufficient documentation

## 2016-01-22 DIAGNOSIS — Y939 Activity, unspecified: Secondary | ICD-10-CM | POA: Insufficient documentation

## 2016-01-22 DIAGNOSIS — F17210 Nicotine dependence, cigarettes, uncomplicated: Secondary | ICD-10-CM | POA: Insufficient documentation

## 2016-01-22 DIAGNOSIS — X501XXA Overexertion from prolonged static or awkward postures, initial encounter: Secondary | ICD-10-CM | POA: Insufficient documentation

## 2016-01-22 DIAGNOSIS — K029 Dental caries, unspecified: Secondary | ICD-10-CM | POA: Insufficient documentation

## 2016-01-22 DIAGNOSIS — S63502A Unspecified sprain of left wrist, initial encounter: Secondary | ICD-10-CM | POA: Insufficient documentation

## 2016-01-22 DIAGNOSIS — F1290 Cannabis use, unspecified, uncomplicated: Secondary | ICD-10-CM | POA: Insufficient documentation

## 2016-01-22 DIAGNOSIS — Y999 Unspecified external cause status: Secondary | ICD-10-CM | POA: Insufficient documentation

## 2016-01-22 MED ORDER — HYDROCODONE-ACETAMINOPHEN 5-325 MG PO TABS: 1.0000 | ORAL_TABLET | Freq: Once | ORAL | Status: DC

## 2016-01-22 MED ORDER — AMOXICILLIN 500 MG PO CAPS: 500.0000 mg | ORAL_CAPSULE | Freq: Three times a day (TID) | ORAL | 0 refills | Status: AC

## 2016-01-22 MED ORDER — HYDROCODONE-ACETAMINOPHEN 5-325 MG PO TABS
ORAL_TABLET | ORAL | Status: AC
  Administered 2016-01-22: 09:00:00
  Filled 2016-01-22: qty 1

## 2016-01-22 MED ORDER — HYDROCODONE-ACETAMINOPHEN 5-325 MG PO TABS: 1.0000 | ORAL_TABLET | ORAL | 0 refills | Status: AC | PRN

## 2016-01-22 NOTE — ED Notes (Signed)
See triage note  States he was using a drill this am .  drill slipped and he felt a pop to left wrist area   Positive pulses

## 2016-01-22 NOTE — Discharge Instructions (Signed)
Call dental clinics as soon as possible to be seen. Wear wrist splint for 5-7 days as needed for comfort. Ice and elevate as needed to reduce pain. Follow-up with Dr. Joice Lofts if any continued problems with your wrist.

## 2016-01-22 NOTE — ED Provider Notes (Signed)
[facility] Emergency Department Provider Note  ____________________________________________  Time seen: Approximately 8:52 AM  I have reviewed the triage vital signs and the nursing notes.   HISTORY  Chief Complaint Wrist Injury   HPI Gabriel Boyd is a 25 y.o. male is here with complaint  of left wrist pain. Patient states that he was using a drill when his wrist popped.Patient denies any previous injury to his wrist in the past. Patient has not taken any over-the-counter medication for pain prior to his arrival in the emergency room. He rates his pain as a 8 out of 10. Pain is worse with range of motion and decreases somewhat with holding his wrist still. He denies any paresthesias into his digits.   Past Medical History:  Diagnosis Date  . IV drug user    heroin, cocaine  . RMSF [facility] spotted fever)     There are no active problems to display for this patient.   No past surgical history on file.  Current Outpatient Rx  . Order #: [id number] Class: Print  . Order #: [id number] Class: Print    Allergies Other  No family history on file.  Social History Social History  Substance Use Topics  . Smoking status: Current Every Day Smoker    Types: Cigarettes  . Smokeless tobacco: Not on file  . Alcohol use Yes     Comment: sometimes    Review of Systems Constitutional: No fever/chills Cardiovascular: Denies chest pain. Respiratory: Denies shortness of breath. Gastrointestinal:   No nausea, no vomiting.   Musculoskeletal: Negative for back pain. Positive left wrist pain. Skin: Negative for rash. Neurological: Negative for numbness.  10-point ROS otherwise negative.  ____________________________________________   PHYSICAL EXAM:  VITAL SIGNS: ED Triage Vitals [01/22/16 0840]  Enc Vitals Group     BP 128/71     Pulse Rate (!) 55     Resp 16     Temp 98 F (36.7 C)     Temp Source Oral     SpO2 100 %     Weight 160  lb (72.6 kg)     Height 6' (1.829 m)     Head Circumference      Peak Flow      Pain Score 8     Pain Loc      Pain Edu?      Excl. in GC?     Constitutional: Alert and oriented. Well appearing and in no acute distress. Eyes: Conjunctivae are normal. PERRL. EOMI. Head: Atraumatic. Nose: No congestion/rhinnorhea. Mouth: Positive multiple dental caries and poor dental hygiene. Neck: No stridor.   Cardiovascular: Normal rate, regular rhythm. Grossly normal heart sounds.  Good peripheral circulation. Respiratory: Normal respiratory effort.  No retractions. Lungs CTAB. Musculoskeletal: On examination of left wrist there is no gross deformity however there is marked tenderness on palpation of the wrist and more prominently on the ulnar aspect. Range of motion is completely restricted secondary to patient's discomfort. There is no soft tissue swelling present. Motor sensory function distal to the injury is intact. Pulse present. Neurologic:  Normal speech and language. No gross focal neurologic deficits are appreciated. No gait instability. Skin:  Skin is warm, dry and intact. No abrasions, ecchymosis or erythema was noted. Psychiatric: Mood and affect are normal. Speech and behavior are normal.  ____________________________________________   LABS (all labs ordered are listed, but only abnormal results are displayed)  Labs Reviewed - No data to display  RADIOLOGY  X-ray  left wrist there is no acute bony abnormalities per radiologist. Beaulah Corin, personally viewed and evaluated these images (plain radiographs) as part of my medical decision making, as well as reviewing the written report by the radiologist. ____________________________________________   PROCEDURES  Procedure(s) performed: None  Procedures  Critical Care performed: No  ____________________________________________   INITIAL IMPRESSION / ASSESSMENT AND PLAN / ED COURSE  Pertinent labs & imaging results  that were available during my care of the patient were reviewed by me and considered in my medical decision making (see chart for details).    Clinical Course  Patient was placed in a cockup wrist splint for support. Patient was also seen for dental pain after discharge instructions were written for his wrist injury. He is discharged with prescription for amoxicillin 500 mg 3 times a day for 10 days for dental pain and caries. He is also given a prescription for Norco as needed for pain for his wrist. He is to wear the wrist splint for approximately 4-5 days and follow-up with Dr. Joice Lofts if any continued problems.   ____________________________________________   FINAL CLINICAL IMPRESSION(S) / ED DIAGNOSES  Final diagnoses:  Left wrist sprain, initial encounter  Pain due to dental caries      NEW MEDICATIONS STARTED DURING THIS VISIT:  Discharge Medication List as of 01/22/2016  9:59 AM    START taking these medications   Details  amoxicillin (AMOXIL) 500 MG capsule Take 1 capsule (500 mg total) by mouth 3 (three) times daily., Starting Tue 01/22/2016, Print         Note:  This document was prepared using Dragon voice recognition software and may include unintentional dictation errors.    Tommi Rumps, PA-C 01/22/16 1436    Nita Sickle, MD 01/22/16 [phone]

## 2016-01-22 NOTE — ED Triage Notes (Signed)
Pt reports he was using a drill and the drill twisted sideways and is now having left wrist pain.

## 2017-02-07 ENCOUNTER — Emergency Department: Payer: Self-pay

## 2017-02-07 ENCOUNTER — Emergency Department
Admission: EM | Admit: 2017-02-07 | Discharge: 2017-02-07 | Disposition: A | Discharge status: Home / Self Care | Payer: Self-pay | Attending: Emergency Medicine | Admitting: Emergency Medicine

## 2017-02-07 DIAGNOSIS — R1084 Generalized abdominal pain: Secondary | ICD-10-CM | POA: Insufficient documentation

## 2017-02-07 DIAGNOSIS — F17210 Nicotine dependence, cigarettes, uncomplicated: Secondary | ICD-10-CM | POA: Insufficient documentation

## 2017-02-07 DIAGNOSIS — Z9660 Presence of unspecified orthopedic joint implant: Secondary | ICD-10-CM | POA: Insufficient documentation

## 2017-02-07 LAB — URINALYSIS, COMPLETE (UACMP) WITH MICROSCOPIC
BILIRUBIN URINE: NEGATIVE
Bacteria, UA: NONE SEEN
GLUCOSE, UA: NEGATIVE mg/dL
HGB URINE DIPSTICK: NEGATIVE
Ketones, ur: NEGATIVE mg/dL
LEUKOCYTES UA: NEGATIVE
NITRITE: NEGATIVE
Protein, ur: NEGATIVE mg/dL
RBC / HPF: NONE SEEN RBC/hpf (ref 0–5)
SPECIFIC GRAVITY, URINE: 1.025 (ref 1.005–1.030)
Squamous Epithelial / LPF: NONE SEEN
pH: 5 (ref 5.0–8.0)

## 2017-02-07 LAB — CBC
HEMATOCRIT: 50.6 % (ref 40.0–52.0)
HEMOGLOBIN: 17.5 g/dL (ref 13.0–18.0)
MCH: 30.4 pg (ref 26.0–34.0)
MCHC: 34.6 g/dL (ref 32.0–36.0)
MCV: 87.9 fL (ref 80.0–100.0)
PLATELETS: 280 10*3/uL (ref 150–440)
RBC: 5.76 MIL/uL (ref 4.40–5.90)
RDW: 12.7 % (ref 11.5–14.5)
WBC: 10.4 10*3/uL (ref 3.8–10.6)

## 2017-02-07 LAB — COMPREHENSIVE METABOLIC PANEL
ALBUMIN: 4.9 g/dL (ref 3.5–5.0)
ALT: 25 U/L (ref 17–63)
ANION GAP: 10 (ref 5–15)
AST: 28 U/L (ref 15–41)
Alkaline Phosphatase: 48 U/L (ref 38–126)
BILIRUBIN TOTAL: 0.7 mg/dL (ref 0.3–1.2)
BUN: 10 mg/dL (ref 6–20)
CHLORIDE: 98 mmol/L — AB (ref 101–111)
CO2: 30 mmol/L (ref 22–32)
Calcium: 10.3 mg/dL (ref 8.9–10.3)
Creatinine, Ser: 1.23 mg/dL (ref 0.61–1.24)
GFR calc Af Amer: >60 mL/min (ref >60)
GLUCOSE: 84 mg/dL (ref 65–99)
Potassium: 4.6 mmol/L (ref 3.5–5.1)
Sodium: 138 mmol/L (ref 135–145)
TOTAL PROTEIN: 8.3 g/dL — AB (ref 6.5–8.1)

## 2017-02-07 LAB — URINE DRUG SCREEN, QUALITATIVE (ARMC ONLY)
Amphetamines, Ur Screen: NOT DETECTED
Barbiturates, Ur Screen: NOT DETECTED
Benzodiazepine, Ur Scrn: POSITIVE — AB
CANNABINOID 50 NG, UR ~~LOC~~: POSITIVE — AB
COCAINE METABOLITE, UR ~~LOC~~: NOT DETECTED
MDMA (ECSTASY) UR SCREEN: NOT DETECTED
Methadone Scn, Ur: NOT DETECTED
Opiate, Ur Screen: POSITIVE — AB
PHENCYCLIDINE (PCP) UR S: NOT DETECTED
Tricyclic, Ur Screen: NOT DETECTED

## 2017-02-07 LAB — LIPASE, BLOOD: LIPASE: 24 U/L (ref 11–51)

## 2017-02-07 MED ORDER — SODIUM CHLORIDE 0.9 % IV BOLUS (SEPSIS)
1000.0000 mL | Freq: Once | INTRAVENOUS | Status: AC
  Administered 2017-02-07: 1000 mL via INTRAVENOUS

## 2017-02-07 MED ORDER — ONDANSETRON HCL 4 MG/2ML IJ SOLN
4.0000 mg | Freq: Once | INTRAMUSCULAR | Status: AC
  Administered 2017-02-07: 4 mg via INTRAVENOUS
  Filled 2017-02-07: qty 2

## 2017-02-07 MED ORDER — MORPHINE SULFATE (PF) 4 MG/ML IV SOLN
4.0000 mg | Freq: Once | INTRAVENOUS | Status: AC
  Administered 2017-02-07: 4 mg via INTRAVENOUS
  Filled 2017-02-07: qty 1

## 2017-02-07 MED ORDER — IOPAMIDOL (ISOVUE-300) INJECTION 61%
30.0000 mL | Freq: Once | INTRAVENOUS | Status: AC
  Administered 2017-02-07: 30 mL via ORAL

## 2017-02-07 MED ORDER — GI COCKTAIL ~~LOC~~
30.0000 mL | Freq: Once | ORAL | Status: AC
  Administered 2017-02-07: 30 mL via ORAL
  Filled 2017-02-07: qty 30

## 2017-02-07 MED ORDER — IOPAMIDOL (ISOVUE-300) INJECTION 61%
100.0000 mL | Freq: Once | INTRAVENOUS | Status: AC | PRN
  Administered 2017-02-07: 100 mL via INTRAVENOUS

## 2017-02-07 NOTE — ED Notes (Signed)
Pt reports abd pain that started on the right side and now radiates around to the left side and he feels pressure on his bladder - pain started 2 weeks ago and was off and on but is now a constant pain - pt also states that he is having pain in his lower back "like I have been lifting weights all day" - denies N/V

## 2017-02-07 NOTE — Discharge Instructions (Signed)
You were seen in the emergency room for abdominal pain. It is important that you follow up closely with gastroenterology.  I'm suspicious she may have a stomach ulcer. Please start taking over-the-counter omeprazole as directed on the packaging, stop taking ibuprofen, Aleve, and other anti-inflammatory such as aspirin.  Please return to the emergency room right away if you are to develop a fever, severe nausea, your pain becomes severe or worsens, you are unable to keep food down, begin vomiting any dark or bloody fluid, you develop any dark or bloody stools, feel dehydrated, or other new concerns or symptoms arise.

## 2017-02-07 NOTE — ED Notes (Signed)
Patient transported to CT 

## 2017-02-07 NOTE — ED Triage Notes (Signed)
Patient c/o intermittent abdominal pain X 2 weeks with increasing frequency and severity over the last few days.  Patient reports pain radiates to back.  Patient denies N/V/D, however reports pelvic pressure

## 2017-02-07 NOTE — ED Notes (Signed)
Patient ambulatory to stat desk without difficulty or distress noted.  Patient reports abdominal pain for 1 week.

## 2017-02-08 NOTE — ED Provider Notes (Addendum)
[facility] Emergency Department Provider Note   ____________________________________________   First MD Initiated Contact with Patient 02/07/17 2055     (approximate)  I have reviewed the triage vital signs and the nursing notes.   HISTORY  Chief Complaint Abdominal Pain    HPI Gabriel Boyd is a 26 y.o. male here for evaluation of abdominal pain  Patient reports he's been having abdominal pain for about 2 weeks, his wife reports it seems to be longer than that, but he reports the pain feels like a heaviness in his upper abdomen. Reports it feels as though his got a "weight" his upper abdomen. The pain seems to be worse in the early morning, tends to go away somewhat during the day. No nausea or vomiting. No fevers or chills. No chest pain or trouble breathing. No dark black or bloody stools.  Does relate that he has used large amounts of ibuprofen over the past month due to pain in his teeth.   Past Medical History:  Diagnosis Date  . IV drug user    heroin, cocaine  . RMSF [facility] spotted fever)     There are no active problems to display for this patient.   Past Surgical History:  Procedure Laterality Date  . JOINT REPLACEMENT      Medications: Ibuprofen as needed  Allergies Other  History reviewed. No pertinent family history.  Social History Social History  Substance Use Topics  . Smoking status: Current Every Day Smoker    Types: Cigarettes  . Smokeless tobacco: Never Used  . Alcohol use Yes     Comment: sometimes  Sports drinks alcohol about one time a month  Review of Systems Constitutional: No fever/chills Eyes: No visual changes. ENT: No sore throat. Cardiovascular: Denies chest pain. Respiratory: Denies shortness of breath. Gastrointestinal: See history of present illness Genitourinary: Negative for dysuria. Musculoskeletal: Negative for back pain. Skin: Negative for rash. Neurological: Negative for  headaches, focal weakness or numbness.    ____________________________________________   PHYSICAL EXAM:  VITAL SIGNS: ED Triage Vitals [02/07/17 2040]  Enc Vitals Group     BP 137/81     Pulse Rate 99     Resp 18     Temp 98.8 F (37.1 C)     Temp Source Oral     SpO2 100 %     Weight 160 lb (72.6 kg)     Height 6' (1.829 m)     Head Circumference      Peak Flow      Pain Score 8     Pain Loc      Pain Edu?      Excl. in GC?     Constitutional: Alert and oriented. Well appearing and in no acute distress. Eyes: Conjunctivae are normal. Head: Atraumatic. Nose: No congestion/rhinnorhea. Mouth/Throat: Mucous membranes are moist. Neck: No stridor.   Cardiovascular: Normal rate, regular rhythm. Grossly normal heart sounds.  Good peripheral circulation. Respiratory: Normal respiratory effort.  No retractions. Lungs CTAB. Gastrointestinal: Soft and nontenderExcept for mild tenderness in the epigastrium and left upper quadrant without rebound or guarding. No distention. Musculoskeletal: No lower extremity tenderness nor edema. Neurologic:  Normal speech and language. No gross focal neurologic deficits are appreciated.  Skin:  Skin is warm, dry and intact. No rash noted. Psychiatric: Mood and affect are normal. Speech and behavior are normal.  ____________________________________________   LABS (all labs ordered are listed, but only abnormal results are displayed)  Labs  Reviewed  COMPREHENSIVE METABOLIC PANEL - Abnormal; Notable for the following:       Result Value   Chloride 98 (*)    Total Protein 8.3 (*)    All other components within normal limits  URINALYSIS, COMPLETE (UACMP) WITH MICROSCOPIC - Abnormal; Notable for the following:    Color, Urine AMBER (*)    APPearance CLEAR (*)    All other components within normal limits  URINE DRUG SCREEN, QUALITATIVE ([facility] ONLY) - Abnormal; Notable for the following:    Opiate, Ur Screen POSITIVE (*)    Cannabinoid 50 Ng,  Ur [place] POSITIVE (*)    Benzodiazepine, Ur Scrn POSITIVE (*)    All other components within normal limits  CBC  LIPASE, BLOOD   ____________________________________________  EKG   ____________________________________________  RADIOLOGY  Ct Abdomen Pelvis W Contrast  Result Date: 02/07/2017 CLINICAL DATA:  Subacute onset of burning sensation and pain about the abdomen and pelvis. Initial encounter. EXAM: CT ABDOMEN AND PELVIS WITH CONTRAST TECHNIQUE: Multidetector CT imaging of the abdomen and pelvis was performed using the standard protocol following bolus administration of intravenous contrast. CONTRAST:  ISOVUE-300 IOPAMIDOL (ISOVUE-300) INJECTION 61% COMPARISON:  None. FINDINGS: Lower chest: The visualized lung bases are grossly clear. The visualized portions of the mediastinum are unremarkable. Hepatobiliary: The liver is unremarkable in appearance. The gallbladder is unremarkable in appearance. The common bile duct remains normal in caliber. Pancreas: The pancreas is within normal limits. Spleen: The spleen is unremarkable in appearance. Adrenals/Urinary Tract: The adrenal glands are unremarkable in appearance. The kidneys are within normal limits. There is no evidence of hydronephrosis. No renal or ureteral stones are identified. No perinephric stranding is seen. Stomach/Bowel: The stomach is unremarkable in appearance. The small bowel is within normal limits. The appendix is normal in caliber, without evidence of appendicitis. The colon is unremarkable in appearance. Vascular/Lymphatic: The abdominal aorta is unremarkable in appearance. The inferior vena cava is grossly unremarkable. No retroperitoneal lymphadenopathy is seen. No pelvic sidewall lymphadenopathy is identified. Reproductive: The bladder is mildly distended and grossly unremarkable. The prostate remains normal in size. Other: No additional soft tissue abnormalities are seen. Musculoskeletal: No acute osseous abnormalities  are identified. The visualized musculature is unremarkable in appearance. IMPRESSION: Unremarkable contrast-enhanced CT of the abdomen and pelvis. Electronically Signed   By: Roanna Raider M.D.   On: 02/07/2017 22:04    ____________________________________________   PROCEDURES  Procedure(s) performed: None  Procedures  Critical Care performed: No  ____________________________________________   INITIAL IMPRESSION / ASSESSMENT AND PLAN / ED COURSE  Pertinent labs & imaging results that were available during my care of the patient were reviewed by me and considered in my medical decision making (see chart for details).  Differential diagnosis includes but is not limited to, abdominal perforation, aortic dissection, cholecystitis, appendicitis, diverticulitis, colitis, esophagitis/gastritis, kidney stone, pyelonephritis, urinary tract infection, aortic aneurysm. All are considered in decision and treatment plan. Based upon the patient's presentation and risk factors, unclear the exact cause of the patient's worsening abdominal pain. History of high NSAID use and location of pain and I'm very suspicious for possible peptic ulcer disease, given his increasing discomfort over the last couple of weeks or a CT scan to further evaluate for etiology. No obvious infectious symptoms. Denies any blood in his stool or black stool.  Patient reports improvement after medications, alert and oriented, discussed with him and he will stop use of ibuprofen and other anti-inflammatories and begin using a regimen of PPI. I  offered prescription report no insurance, and rather buy over-the-counter Nexium or omeprazole.  Instructed on close return precautions, treatment recommendations and follow-up with gastroenterology. Return precautions and treatment recommendations and follow-up discussed with the patient who is agreeable with the plan. Patient agrees he is not driving himself home after receiving morphine  tonight.        ____________________________________________   FINAL CLINICAL IMPRESSION(S) / ED DIAGNOSES  Final diagnoses:  Generalized abdominal pain  Suspect possible peptic ulcer disease    NEW MEDICATIONS STARTED DURING THIS VISIT:  Discharge Medication List as of 02/07/2017 10:29 PM       Note:  This document was prepared using Dragon voice recognition software and may include unintentional dictation errors.     Sharyn CreamerQuale, Mark, MD 02/08/17 0100    Sharyn CreamerQuale, Mark, MD 02/08/17 0100

## 2017-09-29 ENCOUNTER — Emergency Department (HOSPITAL_COMMUNITY): Payer: No Typology Code available for payment source

## 2017-09-29 ENCOUNTER — Encounter (HOSPITAL_COMMUNITY): Payer: Self-pay | Admitting: *Deleted

## 2017-09-29 ENCOUNTER — Other Ambulatory Visit: Payer: Self-pay

## 2017-09-29 ENCOUNTER — Emergency Department (HOSPITAL_COMMUNITY)
Admission: EM | Admit: 2017-09-29 | Discharge: 2017-09-29 | Disposition: A | Discharge status: Home / Self Care | Payer: No Typology Code available for payment source | Attending: Physician Assistant | Admitting: Physician Assistant

## 2017-09-29 DIAGNOSIS — S61411A Laceration without foreign body of right hand, initial encounter: Secondary | ICD-10-CM | POA: Diagnosis not present

## 2017-09-29 DIAGNOSIS — Y9389 Activity, other specified: Secondary | ICD-10-CM | POA: Insufficient documentation

## 2017-09-29 DIAGNOSIS — Z9660 Presence of unspecified orthopedic joint implant: Secondary | ICD-10-CM | POA: Diagnosis not present

## 2017-09-29 DIAGNOSIS — S6991XA Unspecified injury of right wrist, hand and finger(s), initial encounter: Secondary | ICD-10-CM | POA: Diagnosis present

## 2017-09-29 DIAGNOSIS — Y92410 Unspecified street and highway as the place of occurrence of the external cause: Secondary | ICD-10-CM | POA: Diagnosis not present

## 2017-09-29 DIAGNOSIS — S61412A Laceration without foreign body of left hand, initial encounter: Secondary | ICD-10-CM | POA: Diagnosis not present

## 2017-09-29 DIAGNOSIS — M791 Myalgia, unspecified site: Secondary | ICD-10-CM | POA: Insufficient documentation

## 2017-09-29 DIAGNOSIS — Y999 Unspecified external cause status: Secondary | ICD-10-CM | POA: Diagnosis not present

## 2017-09-29 DIAGNOSIS — F17210 Nicotine dependence, cigarettes, uncomplicated: Secondary | ICD-10-CM | POA: Insufficient documentation

## 2017-09-29 DIAGNOSIS — M545 Low back pain: Secondary | ICD-10-CM | POA: Diagnosis not present

## 2017-09-29 MED ORDER — OXYCODONE-ACETAMINOPHEN 5-325 MG PO TABS
1.0000 | ORAL_TABLET | Freq: Once | ORAL | Status: AC
  Administered 2017-09-29: 1 via ORAL
  Filled 2017-09-29: qty 1

## 2017-09-29 MED ORDER — PREDNISONE 20 MG PO TABS: 40.0000 mg | ORAL_TABLET | Freq: Every day | ORAL | 0 refills | Status: AC

## 2017-09-29 MED ORDER — CEPHALEXIN 500 MG PO CAPS: 500.0000 mg | ORAL_CAPSULE | Freq: Four times a day (QID) | ORAL | 0 refills | Status: AC

## 2017-09-29 MED ORDER — METHOCARBAMOL 500 MG PO TABS: 500.0000 mg | ORAL_TABLET | Freq: Two times a day (BID) | ORAL | 0 refills | Status: DC

## 2017-09-29 NOTE — ED Notes (Signed)
Bed: WTR9 Expected date:  Expected time:  Means of arrival:  Comments: 
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
## 2017-09-29 NOTE — Discharge Instructions (Addendum)
Your xrays are reassuring. No broken bones.   Please schedule an appointment with [facility] to establish care and for general health and wellness follow up.   Continue taking tylenol for pain. I have also written you a prescription for muscle relaxer called robaxin. This medicine can make you drowsy so please do not drive or work while taking it.  I have also written his prescription for a steroid medicine which can help with inflammation in your joints.  Please use heat to the back to help with your symptoms.
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
## 2017-10-01 NOTE — ED Notes (Signed)
Patient needed work note 

## 2018-08-04 ENCOUNTER — Encounter (HOSPITAL_COMMUNITY): Payer: Self-pay

## 2018-08-04 ENCOUNTER — Emergency Department (HOSPITAL_COMMUNITY): Payer: Medicaid Other

## 2018-08-04 ENCOUNTER — Emergency Department (HOSPITAL_COMMUNITY)
Admission: EM | Admit: 2018-08-04 | Discharge: 2018-08-05 | Disposition: A | Discharge status: Home / Self Care | Payer: Medicaid Other | Attending: Emergency Medicine | Admitting: Emergency Medicine

## 2018-08-04 DIAGNOSIS — Z79899 Other long term (current) drug therapy: Secondary | ICD-10-CM | POA: Diagnosis not present

## 2018-08-04 DIAGNOSIS — M25572 Pain in left ankle and joints of left foot: Secondary | ICD-10-CM | POA: Insufficient documentation

## 2018-08-04 DIAGNOSIS — F17210 Nicotine dependence, cigarettes, uncomplicated: Secondary | ICD-10-CM | POA: Diagnosis not present

## 2018-08-04 MED ORDER — HYDROCODONE-ACETAMINOPHEN 5-325 MG PO TABS
1.0000 | ORAL_TABLET | Freq: Once | ORAL | Status: AC
  Administered 2018-08-05: 1 via ORAL
  Filled 2018-08-04: qty 1

## 2018-08-04 NOTE — ED Provider Notes (Addendum)
[facility]-EMERGENCY DEPT Provider Note   CSN: [id number] Arrival date & time: 08/04/18  2212     History   Chief Complaint Chief Complaint  Patient presents with  . Ankle Pain    HPI Noemi ChapelJoseph K Flavell is a 28 y.o. male.  The history is provided by the patient and medical records.  Ankle Pain    28 y.o. M here with left ankle pain.  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 Luiz Blare[facility].  States Saturday he fell on the stairs and felt a "pop" in his left ankle.  States he was able to get up and walk around after this but with increasing pain.  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  On Tuesday he was fired because of this.  States yesterday he was helping a friend load a trailer when he felt another pop in his left ankle.  States now pain is excruciating and he is unable to walk without assistance of cane.  He denies any numbness or weakness of his left ankle or foot.  He has been taking Tylenol without much relief.  Past Medical History:  Diagnosis Date  . IV drug user    heroin, cocaine  . RMS[facility]([place] spotted fever)     There are no active problems to display for this patient.   Past Surgical History:  Procedure Laterality Date  . JOINT REPLACEMENT          Home Medications    Prior to Admission medications   Medication Sig Start Date End Date Taking? Authorizing Provider  amoxicillin (AMOXIL) 500 MG capsule Take 1 capsule (500 mg total) by mouth 3 (three) times daily. Patient not taking: Reported on 02/07/2017 01/22/16   Tommi RumpsSummers, Rhonda L, PA-C  HYDROcodone-acetaminophen (NORCO/VICODIN) 5-325 MG tablet Take 1 tablet by mouth every 4 (four) hours as needed for moderate pain. Patient not taking: Reported on 02/07/2017 01/22/16   Tommi RumpsSummers, Rhonda L, PA-C  methocarbamol (ROBAXIN) 500 MG tablet Take 1  tablet (500 mg total) by mouth 2 (two) times daily. 09/29/17   Kellie ShropshireShrosbree, Emily J, PA-C  predniSONE (DELTASONE) 20 MG tablet Take 2 tablets (40 mg total) by mouth daily. 09/29/17   Kellie ShropshireShrosbree, Emily J, PA-C    Family History History reviewed. No pertinent family history.  Social History Social History   Tobacco Use  . Smoking status: Current Every Day Smoker    Types: Cigarettes  . Smokeless tobacco: Never Used  Substance Use Topics  . Alcohol use: Yes    Comment: sometimes  . Drug use: No    Types: Marijuana, Cocaine, IV     Allergies   Motrin [ibuprofen] and Other   Review of Systems Review of Systems  Musculoskeletal: Positive for arthralgias.  All other systems reviewed and are negative.    Physical Exam Updated Vital Signs BP (!) 145/101 (BP Location: Right Arm)   Pulse 84   Temp 98.7 F (37.1 C) (Oral)   Resp 20   SpO2 99%   Physical Exam Vitals signs and nursing note reviewed.  Constitutional:      Appearance: He is well-developed.  HENT:     Head: Normocephalic and atraumatic.  Eyes:     Conjunctiva/sclera: Conjunctivae normal.     Pupils: Pupils are equal, round, and reactive to light.  Neck:     Musculoskeletal: Normal range of motion.  Cardiovascular:     Rate and Rhythm: Normal rate and regular rhythm.     Heart sounds: Normal heart sounds.  Pulmonary:     Effort: Pulmonary effort is normal.     Breath sounds: Normal breath sounds.  Abdominal:     General: Bowel sounds are normal.     Palpations: Abdomen is soft.  Musculoskeletal: Normal range of motion.     Comments: Left ankle with well-healed scar from Achilles tendon repair, there is no significant swelling or bony deformity noted, moving toes normally, DP pulse intact with normal cap refill and sensation distally; Thompson test is negative  Skin:    General: Skin is warm and dry.  Neurological:     Mental Status: He is alert and oriented to person, place, and time.      ED Treatments  / Results  Labs (all labs ordered are listed, but only abnormal results are displayed) Labs Reviewed - No data to display  EKG None  Radiology Dg Ankle Complete Left  Result Date: 08/04/2018 CLINICAL DATA:  28 year old male with pain and nausea in the left ankle after "felt a pop". History of previous left Achilles tendon rupture/laceration. EXAM: LEFT ANKLE COMPLETE - 3+ VIEW COMPARISON:  None. FINDINGS: Degenerative spurring and dystrophic soft tissue calcifications at the left Achilles insertion site on the posterior calcaneus. The calcaneus appears intact. Mortise joint alignment is preserved. Talar dome intact. No joint effusion identified. Distal tibia and fibula intact. No acute osseous abnormality identified. IMPRESSION: No acute osseous abnormality identified about the left ankle. Chronic changes at the Achilles insertion. Electronically Signed   By: Odessa Fleming M.D.   On: 08/04/2018 22:52    Procedures Procedures (including critical care time)  Medications Ordered in ED Medications  HYDROcodone-acetaminophen (NORCO/VICODIN) 5-325 MG per tablet 1 tablet (1 tablet Oral Given 08/05/18 0006)     Initial Impression / Assessment and Plan / ED Course  I have reviewed the triage vital signs and the nursing notes.  Pertinent labs & imaging results that were available during my care of the patient were reviewed by me and considered in my medical decision making (see chart for details).  28 year old male here with left ankle pain.  Remote history of Achilles tendon rupture and repair in 2003 by Dr. Luiz Blare.  Has had 2 instances in the past few days where her left ankle popped.  Now walking with a cane due to increasing pain.  Exam without any new swelling or bony deformity noted.  He does have well-healed incision of posterior left ankle consistent with his Achilles tendon repair.  He has a negative Janee Morn test here today.  Patient placed in cam walker and given crutches.  Encouraged to  follow-up closely with orthopedics, Dr. Luiz Blare is practice is on call today so given information for follow-up.  Has reported allergy to NSAIDs so given a few tramadol for pain control.  Encouraged ice and elevation.  Can return here for any new or acute changes.  1:47 AM  at time of discharge, patient upset and yelling about prescriptions he was given saying it was "weak".  He ripped up his prescription of tramadol and was arguing with nurse about this.  He then requested new prescription which I declined.  Patient left the ED with discharge papers.  Final Clinical Impressions(s) / ED Diagnoses   Final diagnoses:  Acute left ankle pain    ED Discharge Orders  Ordered    traMADol (ULTRAM) 50 MG tablet  Every 6 hours PRN     08/05/18 0111           Garlon HatchetSanders, Cyprian Gongaware M, PA-C 08/05/18 0130    Garlon HatchetSanders, Miriana Gaertner M, PA-C 08/05/18 0147    Molpus, Jonny RuizJohn, MD 08/05/18 [phone]

## 2018-08-04 NOTE — ED Triage Notes (Signed)
Pt severed his left ankle tendon a few years ago, Saturday night he slipped and felt something pop in that same ankle, he states he's having to walk with a cane, and feels nauseated

## 2018-08-05 MED ORDER — TRAMADOL HCL 50 MG PO TABS: 50.0000 mg | ORAL_TABLET | Freq: Four times a day (QID) | ORAL | 0 refills | Status: DC | PRN

## 2018-08-05 NOTE — Discharge Instructions (Addendum)
Take the prescribed medication as directed. Follow-up with Dr. Luiz Blare-- this is who did your original surgery.  His practice is on call today.  Can call in the morning to arrange appt. Return to the ED for new or worsening symptoms.

## 2018-11-03 ENCOUNTER — Emergency Department (HOSPITAL_COMMUNITY)
Admission: EM | Admit: 2018-11-03 | Discharge: 2018-11-03 | Disposition: A | Discharge status: Home / Self Care | Payer: Medicaid Other | Attending: Emergency Medicine | Admitting: Emergency Medicine

## 2018-11-03 ENCOUNTER — Emergency Department (HOSPITAL_COMMUNITY): Payer: Medicaid Other

## 2018-11-03 ENCOUNTER — Encounter (HOSPITAL_COMMUNITY): Payer: Self-pay | Admitting: *Deleted

## 2018-11-03 ENCOUNTER — Other Ambulatory Visit: Payer: Self-pay

## 2018-11-03 DIAGNOSIS — F17210 Nicotine dependence, cigarettes, uncomplicated: Secondary | ICD-10-CM | POA: Insufficient documentation

## 2018-11-03 DIAGNOSIS — S0001XA Abrasion of scalp, initial encounter: Secondary | ICD-10-CM | POA: Diagnosis not present

## 2018-11-03 DIAGNOSIS — W1830XA Fall on same level, unspecified, initial encounter: Secondary | ICD-10-CM | POA: Insufficient documentation

## 2018-11-03 DIAGNOSIS — R569 Unspecified convulsions: Secondary | ICD-10-CM | POA: Insufficient documentation

## 2018-11-03 DIAGNOSIS — F1290 Cannabis use, unspecified, uncomplicated: Secondary | ICD-10-CM | POA: Diagnosis not present

## 2018-11-03 DIAGNOSIS — S0003XA Contusion of scalp, initial encounter: Secondary | ICD-10-CM | POA: Diagnosis not present

## 2018-11-03 DIAGNOSIS — Y939 Activity, unspecified: Secondary | ICD-10-CM | POA: Insufficient documentation

## 2018-11-03 DIAGNOSIS — Y92240 Courthouse as the place of occurrence of the external cause: Secondary | ICD-10-CM | POA: Insufficient documentation

## 2018-11-03 DIAGNOSIS — R51 Headache: Secondary | ICD-10-CM | POA: Insufficient documentation

## 2018-11-03 DIAGNOSIS — Y999 Unspecified external cause status: Secondary | ICD-10-CM | POA: Diagnosis not present

## 2018-11-03 DIAGNOSIS — F1990 Other psychoactive substance use, unspecified, uncomplicated: Secondary | ICD-10-CM | POA: Insufficient documentation

## 2018-11-03 LAB — RAPID URINE DRUG SCREEN, HOSP PERFORMED
Amphetamines: NOT DETECTED
Barbiturates: NOT DETECTED
Benzodiazepines: POSITIVE — AB
Cocaine: NOT DETECTED
Opiates: NOT DETECTED
Tetrahydrocannabinol: POSITIVE — AB

## 2018-11-03 LAB — COMPREHENSIVE METABOLIC PANEL WITH GFR
ALT: 81 U/L — ABNORMAL HIGH (ref 0–44)
AST: 36 U/L (ref 15–41)
Albumin: 3.8 g/dL (ref 3.5–5.0)
Alkaline Phosphatase: 72 U/L (ref 38–126)
Anion gap: 12 (ref 5–15)
BUN: <5 mg/dL — ABNORMAL LOW (ref 6–20)
CO2: 23 mmol/L (ref 22–32)
Calcium: 9.4 mg/dL (ref 8.9–10.3)
Chloride: 101 mmol/L (ref 98–111)
Creatinine, Ser: 1.11 mg/dL (ref 0.61–1.24)
GFR calc Af Amer: >60 mL/min
GFR calc non Af Amer: >60 mL/min
Glucose, Bld: 152 mg/dL — ABNORMAL HIGH (ref 70–99)
Potassium: 4.2 mmol/L (ref 3.5–5.1)
Sodium: 136 mmol/L (ref 135–145)
Total Bilirubin: 0.3 mg/dL (ref 0.3–1.2)
Total Protein: 7.3 g/dL (ref 6.5–8.1)

## 2018-11-03 LAB — CBC
HCT: 48.8 % (ref 39.0–52.0)
Hemoglobin: 16.7 g/dL (ref 13.0–17.0)
MCH: 32.1 pg (ref 26.0–34.0)
MCHC: 34.2 g/dL (ref 30.0–36.0)
MCV: 93.7 fL (ref 80.0–100.0)
Platelets: 341 10*3/uL (ref 150–400)
RBC: 5.21 MIL/uL (ref 4.22–5.81)
RDW: 12.9 % (ref 11.5–15.5)
WBC: 13.6 10*3/uL — ABNORMAL HIGH (ref 4.0–10.5)
nRBC: 0 % (ref 0.0–0.2)

## 2018-11-03 LAB — ETHANOL: Alcohol, Ethyl (B): <10 mg/dL (ref <10)

## 2018-11-03 MED ORDER — ACETAMINOPHEN 500 MG PO TABS
1000.0000 mg | ORAL_TABLET | Freq: Once | ORAL | Status: AC
  Administered 2018-11-03: 12:00:00 1000 mg via ORAL
  Filled 2018-11-03: qty 2

## 2018-11-03 NOTE — ED Provider Notes (Signed)
[facility]   CSN: [id number] Arrival date & time: 11/03/18  1002    History   Chief Complaint Chief Complaint  Patient presents with  . Seizures    HPI Gabriel ChapelJoseph K Boyd is a 28 y.o. male.     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 Just PTA, he [place] and the next thing he remembers is waking up on the ground with EMS around him. He did not feel any symptoms beforehand including no lightheadedness, SOB, diaphoresis, or CP. Witnesses reported jerking activity for ~2 minutes. Pt denies loss of bowel/bladder or biting tongue.  Has never happened before.  He denies any recent illness or fevers.  He denies any alcohol or drug use.  No family history of seizure disorder.  He denies any personal history of heart problems.  He reports pain on the right side of his head which he struck during his fall.  The history is provided by the patient.  Seizures    Past Medical History:  Diagnosis Date  . IV drug user    heroin, cocaine  . RMSF [facility]([place] spotted fever)     There are no active problems to display for this patient.   History reviewed. No pertinent surgical history.      Home Medications    Prior to Admission medications   Medication Sig Start Date End Date Taking? Authorizing Provider  amoxicillin (AMOXIL) 500 MG capsule Take 1 capsule (500 mg total) by mouth 3 (three) times daily. Patient not taking: Reported on 02/07/2017 01/22/16   Tommi RumpsSummers, Rhonda L, PA-C  HYDROcodone-acetaminophen (NORCO/VICODIN) 5-325 MG tablet Take 1 tablet by mouth every 4 (four) hours as needed for moderate pain. Patient not taking: Reported on 02/07/2017 01/22/16   Tommi RumpsSummers, Rhonda L, PA-C  methocarbamol (ROBAXIN) 500 MG tablet Take 1 tablet (500 mg total) by mouth 2 (two) times daily. 09/29/17   Kellie ShropshireShrosbree, Emily J, PA-C  predniSONE (DELTASONE) 20 MG tablet Take 2 tablets (40 mg total) by mouth  daily. 09/29/17   Kellie ShropshireShrosbree, Emily J, PA-C    Family History No family history on file.  Social History Social History   Tobacco Use  . Smoking status: Current Every Day Smoker    Packs/day: 2.00    Types: Cigarettes  . Smokeless tobacco: Never Used  Substance Use Topics  . Alcohol use: Yes    Comment: sometimes  . Drug use: No    Types: Marijuana, IV    Comment: Pt states no longer uses cocaine 2015     Allergies   Motrin [ibuprofen] and Other   Review of Systems Review of Systems  Neurological: Positive for seizures.   All other systems reviewed and are negative except that which was mentioned in HPI   Physical Exam Updated Vital Signs BP 139/84   Pulse (!) 115   Temp 98.8 F (37.1 C) (Oral)   Resp (!) 26   Ht 6' (1.829 m)   Wt 77.1 kg   SpO2 96%   BMI 23.06 kg/m   Physical Exam Vitals signs and nursing note reviewed.  Constitutional:      General: He is not in acute distress.    Appearance: He is well-developed.     Comments: Awake, alert  HENT:     Head: Normocephalic.     Comments: Abrasion and hematoma R parietal scalp Eyes:     Extraocular Movements: Extraocular movements intact.  Conjunctiva/sclera: Conjunctivae normal.     Pupils: Pupils are equal, round, and reactive to light.     Comments: Pupils dilated  Neck:     Musculoskeletal: Neck supple.  Cardiovascular:     Rate and Rhythm: Regular rhythm. Tachycardia present.     Heart sounds: Normal heart sounds. No murmur.  Pulmonary:     Effort: Pulmonary effort is normal. No respiratory distress.     Breath sounds: Normal breath sounds.  Abdominal:     General: Bowel sounds are normal. There is no distension.     Palpations: Abdomen is soft.     Tenderness: There is no abdominal tenderness.  Musculoskeletal:        General: No tenderness or signs of injury.  Skin:    General: Skin is warm and dry.  Neurological:     Mental Status: He is alert and oriented to person, place, and  time.     Cranial Nerves: No cranial nerve deficit.     Motor: No abnormal muscle tone.     Deep Tendon Reflexes: Reflexes are normal and symmetric.     Comments: Fluent speech, normal finger-to-nose testing, negative pronator drift 5/5 strength and normal sensation x all 4 extremities  Psychiatric:        Thought Content: Thought content normal.        Judgment: Judgment normal.      ED Treatments / Results  Labs (all labs ordered are listed, but only abnormal results are displayed) Labs Reviewed  COMPREHENSIVE METABOLIC PANEL  CBC  ETHANOL  RAPID URINE DRUG SCREEN, HOSP PERFORMED    EKG EKG Interpretation  Date/Time:  Wednesday Nov 03 2018 10:06:44 EDT Ventricular Rate:  114 PR Interval:    QRS Duration: 78 QT Interval:  314 QTC Calculation: 433 R Axis:   79 Text Interpretation:  Sinus tachycardia Borderline T wave abnormalities since previous tracing, new tachycardia and inferior T wave inversions Confirmed by Frederick Peers [phone]) on 11/03/2018 10:09:39 AM   Radiology Ct Head Wo Contrast  Result Date: 11/03/2018 CLINICAL DATA:  Fall, seizure EXAM: CT HEAD WITHOUT CONTRAST TECHNIQUE: Contiguous axial images were obtained from the base of the skull through the vertex without intravenous contrast. COMPARISON:  None. FINDINGS: Brain: No evidence of acute infarction, hemorrhage, hydrocephalus, extra-axial collection or mass lesion/mass effect. Vascular: No hyperdense vessel or unexpected calcification. Skull: Normal. Negative for fracture or focal lesion. Sinuses/Orbits: No acute finding. Other: Chronic fracture deformity of the left zygoma. IMPRESSION: No acute intracranial pathology. Electronically Signed   By: Lauralyn Primes M.D.   On: 11/03/2018 11:00    Procedures Procedures (including critical care time)  Medications Ordered in ED Medications - No data to display   Initial Impression / Assessment and Plan / ED Course  I have reviewed the triage vital signs and the  nursing notes.  Pertinent labs & imaging results that were available during my care of the patient were reviewed by me and considered in my medical decision making (see chart for details).       Awake, alert on exam, tachycardic, afebrile. Dilated pupils. Neurologic exam normal. DDx includes seizure, syncope, drug use. Tachycardia and pupillary dilation do raise suspicion for substance ingestion. The fact that he was immediately alert and oriented on the ground after episode w/ no post-ictal state suggests tonic-clonic seizure may be less likely.   Head CT negative acute. Labwork is pending and I am signing patient out to PA [place] Robinson. If labwork reassuring and pt  remains neurologically intact, I anticipate discharge with outpatient referral to neurology and driving restrictions.  Final Clinical Impressions(s) / ED Diagnoses   Final diagnoses:  None    ED Discharge Orders    None       Little, Ambrose Finland, MD 11/03/18 1121

## 2018-11-03 NOTE — ED Notes (Signed)
Removed pt's IV from the left [facility], per Dr. Clarene Duke. IV site was: clean, dry and intact.

## 2018-11-03 NOTE — ED Notes (Signed)
Patient verbalizes understanding of discharge instructions. Opportunity for questioning and answers were provided. Armband removed by staff, pt discharged from ED. Pt ambulatory at discharge, pt wheeled to lobby for comfort.
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
## 2018-11-03 NOTE — ED Provider Notes (Signed)
Pt transferred to CDU, care assumed from Dr. Clarene DukeLittle. See her note for full HPI and workup. Briefly, [place] then had witnessed jerking-like activity. No incontinence, no tongue biting, no prodrome. No infectious sx recently. No medical problems.  Remote IVDU, denies today. On initial evaluation, pt noted to be tachycardic, w dilated pupils. Normal neuro. CT head is negative. Pending labs. If not significantly abnormal, plan for discharge with ambulatory neurology referral and driving restrictions.  Physical Exam  BP 139/84   Pulse (!) 115   Temp 98.8 F (37.1 C) (Oral)   Resp (!) 26   Ht 6' (1.829 m)   Wt 77.1 kg   SpO2 96%   BMI 23.06 kg/m   Physical Exam Vitals signs and nursing note reviewed.  Constitutional:      General: He is not in acute distress.    Appearance: He is well-developed.  HENT:     Head: Normocephalic and atraumatic.  Eyes:     Conjunctiva/sclera: Conjunctivae normal.  Pulmonary:     Effort: Pulmonary effort is normal.  Abdominal:     Palpations: Abdomen is soft.  Skin:    General: Skin is warm.  Neurological:     Mental Status: He is alert.  Psychiatric:        Behavior: Behavior normal.    Results for orders placed or performed during the hospital encounter of 11/03/18  Comprehensive metabolic panel  Result Value Ref Range   Sodium 136 135 - 145 mmol/L   Potassium 4.2 3.5 - 5.1 mmol/L   Chloride 101 98 - 111 mmol/L   CO2 23 22 - 32 mmol/L   Glucose, Bld 152 (H) 70 - 99 mg/dL   BUN <5 (L) 6 - 20 mg/dL   Creatinine, Ser 8.111.11 0.61 - 1.24 mg/dL   Calcium 9.4 8.9 - 91.410.3 mg/dL   Total Protein 7.3 6.5 - 8.1 g/dL   Albumin 3.8 3.5 - 5.0 g/dL   AST 36 15 - 41 U/L   ALT 81 (H) 0 - 44 U/L   Alkaline Phosphatase 72 38 - 126 U/L   Total Bilirubin 0.3 0.3 - 1.2 mg/dL   GFR calc non Af Amer >60 >60 mL/min   GFR calc Af Amer >60 >60 mL/min   Anion gap 12 5 - 15  CBC  Result Value Ref Range   WBC 13.6 (H) 4.0 - 10.5 K/uL   RBC 5.21 4.22 -  5.81 MIL/uL   Hemoglobin 16.7 13.0 - 17.0 g/dL   HCT 78.248.8 95.639.0 - 21.352.0 %   MCV 93.7 80.0 - 100.0 fL   MCH 32.1 26.0 - 34.0 pg   MCHC 34.2 30.0 - 36.0 g/dL   RDW 08.612.9 57.811.5 - 46.915.5 %   Platelets 341 150 - 400 K/uL   nRBC 0.0 0.0 - 0.2 %  Ethanol  Result Value Ref Range   Alcohol, Ethyl (B) <10 <10 mg/dL  Urine rapid drug screen (hosp performed)  Result Value Ref Range   Opiates NONE DETECTED NONE DETECTED   Cocaine NONE DETECTED NONE DETECTED   Benzodiazepines POSITIVE (A) NONE DETECTED   Amphetamines NONE DETECTED NONE DETECTED   Tetrahydrocannabinol POSITIVE (A) NONE DETECTED   Barbiturates NONE DETECTED NONE DETECTED   Ct Head Wo Contrast  Result Date: 11/03/2018 CLINICAL DATA:  Fall, seizure EXAM: CT HEAD WITHOUT CONTRAST TECHNIQUE: Contiguous axial images were obtained from the base of the skull through the vertex without intravenous contrast. COMPARISON:  None. FINDINGS: Brain: No evidence  of acute infarction, hemorrhage, hydrocephalus, extra-axial collection or mass lesion/mass effect. Vascular: No hyperdense vessel or unexpected calcification. Skull: Normal. Negative for fracture or focal lesion. Sinuses/Orbits: No acute finding. Other: Chronic fracture deformity of the left zygoma. IMPRESSION: No acute intracranial pathology. Electronically Signed   By: Lauralyn Primes M.D.   On: 11/03/2018 11:00     ED Course/Procedures     Procedures  MDM  Patient here with seizure-like [place].  No medical problems, no history of seizure disorder.  Work-up in the ED is reassuring, negative head CT, labs with mild leukocytosis, otherwise reassuring.  UDS is positive for benzodiazepines and cannabinoids.  Discussed with patient positive urine drug screen and potential for withdrawal from benzodiazepines as cause of seizure-like activity today.  Patient continues to deny any drug use.  Denies any daily medications.  He states he has been around  marijuana, however has not used it in some time.  He is well-appearing and without complaints on reevaluation.  Patient will be discharged with ambulatory referral to neurology.  Patient given strict driving restrictions and he verbalized understanding and agrees with care plan.  Return precautions discussed.  Discussed results, findings, treatment and follow up. Patient advised of return precautions. Patient verbalized understanding and agreed with plan.        Robinson, [place] N, PA-C 11/03/18 1412    Little, Ambrose Finland, MD 11/03/18 1540
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
## 2018-11-03 NOTE — ED Triage Notes (Signed)
Gabriel Boyd, wife, [phone], please call with questions or updates

## 2019-06-11 ENCOUNTER — Other Ambulatory Visit: Payer: Self-pay

## 2019-06-11 ENCOUNTER — Emergency Department (HOSPITAL_COMMUNITY): Payer: Federal, State, Local not specified - PPO

## 2019-06-11 ENCOUNTER — Encounter (HOSPITAL_COMMUNITY): Payer: Self-pay | Admitting: Emergency Medicine

## 2019-06-11 ENCOUNTER — Other Ambulatory Visit (HOSPITAL_COMMUNITY): Payer: Self-pay

## 2019-06-11 ENCOUNTER — Emergency Department (HOSPITAL_COMMUNITY)
Admission: EM | Admit: 2019-06-11 | Discharge: 2019-06-12 | Disposition: A | Discharge status: Home / Self Care | Payer: Federal, State, Local not specified - PPO | Attending: Emergency Medicine | Admitting: Emergency Medicine

## 2019-06-11 DIAGNOSIS — S4992XA Unspecified injury of left shoulder and upper arm, initial encounter: Secondary | ICD-10-CM

## 2019-06-11 DIAGNOSIS — T07XXXA Unspecified multiple injuries, initial encounter: Secondary | ICD-10-CM

## 2019-06-11 DIAGNOSIS — S4982XA Other specified injuries of left shoulder and upper arm, initial encounter: Secondary | ICD-10-CM | POA: Insufficient documentation

## 2019-06-11 DIAGNOSIS — Y9389 Activity, other specified: Secondary | ICD-10-CM | POA: Insufficient documentation

## 2019-06-11 DIAGNOSIS — T148XXA Other injury of unspecified body region, initial encounter: Secondary | ICD-10-CM | POA: Insufficient documentation

## 2019-06-11 DIAGNOSIS — Y999 Unspecified external cause status: Secondary | ICD-10-CM | POA: Insufficient documentation

## 2019-06-11 DIAGNOSIS — R4182 Altered mental status, unspecified: Secondary | ICD-10-CM | POA: Insufficient documentation

## 2019-06-11 DIAGNOSIS — R10819 Abdominal tenderness, unspecified site: Secondary | ICD-10-CM | POA: Insufficient documentation

## 2019-06-11 DIAGNOSIS — Y929 Unspecified place or not applicable: Secondary | ICD-10-CM | POA: Diagnosis not present

## 2019-06-11 DIAGNOSIS — F17210 Nicotine dependence, cigarettes, uncomplicated: Secondary | ICD-10-CM | POA: Insufficient documentation

## 2019-06-11 DIAGNOSIS — R000 Tachycardia, unspecified: Secondary | ICD-10-CM | POA: Insufficient documentation

## 2019-06-11 DIAGNOSIS — Z23 Encounter for immunization: Secondary | ICD-10-CM | POA: Diagnosis not present

## 2019-06-11 DIAGNOSIS — S1980XA Other specified injuries of unspecified part of neck, initial encounter: Secondary | ICD-10-CM | POA: Diagnosis present

## 2019-06-11 DIAGNOSIS — F10920 Alcohol use, unspecified with intoxication, uncomplicated: Secondary | ICD-10-CM | POA: Diagnosis not present

## 2019-06-11 DIAGNOSIS — Z79899 Other long term (current) drug therapy: Secondary | ICD-10-CM | POA: Diagnosis not present

## 2019-06-11 LAB — COMPREHENSIVE METABOLIC PANEL
ALT: 45 U/L — ABNORMAL HIGH (ref 0–44)
AST: 36 U/L (ref 15–41)
Albumin: 3.8 g/dL (ref 3.5–5.0)
Alkaline Phosphatase: 42 U/L (ref 38–126)
Anion gap: 13 (ref 5–15)
BUN: 9 mg/dL (ref 6–20)
CO2: 23 mmol/L (ref 22–32)
Calcium: 8.8 mg/dL — ABNORMAL LOW (ref 8.9–10.3)
Chloride: 103 mmol/L (ref 98–111)
Creatinine, Ser: 0.94 mg/dL (ref 0.61–1.24)
GFR calc Af Amer: >60 mL/min (ref >60)
GFR calc non Af Amer: >60 mL/min (ref >60)
Glucose, Bld: 112 mg/dL — ABNORMAL HIGH (ref 70–99)
Potassium: 3.8 mmol/L (ref 3.5–5.1)
Sodium: 139 mmol/L (ref 135–145)
Total Bilirubin: 0.8 mg/dL (ref 0.3–1.2)
Total Protein: 6.2 g/dL — ABNORMAL LOW (ref 6.5–8.1)

## 2019-06-11 LAB — SAMPLE TO BLOOD BANK

## 2019-06-11 LAB — CBC
HCT: 47.6 % (ref 39.0–52.0)
Hemoglobin: 16.2 g/dL (ref 13.0–17.0)
MCH: 32.1 pg (ref 26.0–34.0)
MCHC: 34 g/dL (ref 30.0–36.0)
MCV: 94.3 fL (ref 80.0–100.0)
Platelets: 230 10*3/uL (ref 150–400)
RBC: 5.05 MIL/uL (ref 4.22–5.81)
RDW: 12.9 % (ref 11.5–15.5)
WBC: 9.7 10*3/uL (ref 4.0–10.5)
nRBC: 0 % (ref 0.0–0.2)

## 2019-06-11 LAB — I-STAT CHEM 8, ED
BUN: 8 mg/dL (ref 6–20)
Calcium, Ion: 1.17 mmol/L (ref 1.15–1.40)
Chloride: 103 mmol/L (ref 98–111)
Creatinine, Ser: 1 mg/dL (ref 0.61–1.24)
Glucose, Bld: 113 mg/dL — ABNORMAL HIGH (ref 70–99)
HCT: 48 % (ref 39.0–52.0)
Hemoglobin: 16.3 g/dL (ref 13.0–17.0)
Potassium: 3.7 mmol/L (ref 3.5–5.1)
Sodium: 139 mmol/L (ref 135–145)
TCO2: 27 mmol/L (ref 22–32)

## 2019-06-11 LAB — LACTIC ACID, PLASMA: Lactic Acid, Venous: 2.2 mmol/L (ref 0.5–1.9)

## 2019-06-11 LAB — ETHANOL: Alcohol, Ethyl (B): 63 mg/dL — ABNORMAL HIGH (ref <10)

## 2019-06-11 LAB — CDS SEROLOGY

## 2019-06-11 LAB — PROTIME-INR
INR: 1.3 — ABNORMAL HIGH (ref 0.8–1.2)
Prothrombin Time: 16 seconds — ABNORMAL HIGH (ref 11.4–15.2)

## 2019-06-11 MED ORDER — METHOCARBAMOL 500 MG PO TABS: 500.0000 mg | ORAL_TABLET | Freq: Two times a day (BID) | ORAL | 0 refills | Status: AC

## 2019-06-11 MED ORDER — FENTANYL CITRATE (PF) 100 MCG/2ML IJ SOLN
50.0000 ug | Freq: Once | INTRAMUSCULAR | Status: AC
  Administered 2019-06-11: 07:00:00 50 ug via INTRAVENOUS
  Filled 2019-06-11: qty 2

## 2019-06-11 MED ORDER — IOHEXOL 300 MG/ML  SOLN
100.0000 mL | Freq: Once | INTRAMUSCULAR | Status: AC | PRN
  Administered 2019-06-11: 05:00:00 100 mL via INTRAVENOUS

## 2019-06-11 MED ORDER — HYDROCODONE-ACETAMINOPHEN 5-325 MG PO TABS
2.0000 | ORAL_TABLET | Freq: Once | ORAL | Status: AC
  Administered 2019-06-11: 2 via ORAL
  Filled 2019-06-11: qty 2

## 2019-06-11 MED ORDER — FENTANYL CITRATE (PF) 100 MCG/2ML IJ SOLN
50.0000 ug | Freq: Once | INTRAMUSCULAR | Status: AC
  Administered 2019-06-11: 05:00:00 50 ug via INTRAVENOUS
  Filled 2019-06-11: qty 2

## 2019-06-11 MED ORDER — SODIUM CHLORIDE 0.9 % IV BOLUS: 500.0000 mL | Freq: Once | INTRAVENOUS | Status: DC

## 2019-06-11 MED ORDER — TETANUS-DIPHTH-ACELL PERTUSSIS 5-2.5-18.5 LF-MCG/0.5 IM SUSP
0.5000 mL | Freq: Once | INTRAMUSCULAR | Status: AC
  Administered 2019-06-11: 05:00:00 0.5 mL via INTRAMUSCULAR
  Filled 2019-06-11: qty 0.5

## 2019-06-11 MED ORDER — FENTANYL CITRATE (PF) 100 MCG/2ML IJ SOLN
50.0000 ug | Freq: Once | INTRAMUSCULAR | Status: AC
  Administered 2019-06-11: 03:00:00 50 ug via INTRAVENOUS
  Filled 2019-06-11: qty 2
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
## 2019-06-11 NOTE — ED Notes (Signed)
Pt. Documented in error see above note in chart. 

## 2019-06-11 NOTE — ED Notes (Signed)
Patient taken to CT.

## 2019-06-11 NOTE — ED Notes (Signed)
Date and time results received: 06/11/19  (use smartphrase ".now" to insert current time)  Test: Lactic Acid Critical Value: 2.2  Name of Provider Notified: Pollina    Orders Received? Or Actions Taken?: Actions Taken: EDP notifed
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
## 2019-06-11 NOTE — ED Notes (Signed)
Pt ambulated to restroom w/o assistance. 

## 2019-06-11 NOTE — Discharge Instructions (Addendum)
1. Medications: Tylenol for pain control, Robaxin for muscle spasms.  Usual home medications 2. Treatment: rest, ice, elevate and use brace, drink plenty of fluids, gentle stretching 3. Follow Up: Please followup with orthopedics as directed; Please return to the ER for worsening symptoms or other concerns

## 2019-06-12 NOTE — ED Notes (Signed)
Patient discharged from [facility] on 06/11/2019 at 0800 but not taken out of the chart. Will discharge out of system now.

## 2019-11-20 ENCOUNTER — Emergency Department (HOSPITAL_COMMUNITY): Payer: Federal, State, Local not specified - PPO

## 2019-11-20 ENCOUNTER — Other Ambulatory Visit: Payer: Self-pay

## 2019-11-20 ENCOUNTER — Emergency Department (HOSPITAL_COMMUNITY)
Admission: EM | Admit: 2019-11-20 | Discharge: 2019-11-20 | Disposition: A | Discharge status: Home / Self Care | Payer: Federal, State, Local not specified - PPO | Attending: Emergency Medicine | Admitting: Emergency Medicine

## 2019-11-20 DIAGNOSIS — Y929 Unspecified place or not applicable: Secondary | ICD-10-CM | POA: Insufficient documentation

## 2019-11-20 DIAGNOSIS — Y9355 Activity, bike riding: Secondary | ICD-10-CM | POA: Insufficient documentation

## 2019-11-20 DIAGNOSIS — F17210 Nicotine dependence, cigarettes, uncomplicated: Secondary | ICD-10-CM | POA: Diagnosis not present

## 2019-11-20 DIAGNOSIS — S92414A Nondisplaced fracture of proximal phalanx of right great toe, initial encounter for closed fracture: Secondary | ICD-10-CM | POA: Diagnosis not present

## 2019-11-20 DIAGNOSIS — L03115 Cellulitis of right lower limb: Secondary | ICD-10-CM | POA: Diagnosis not present

## 2019-11-20 DIAGNOSIS — S99921A Unspecified injury of right foot, initial encounter: Secondary | ICD-10-CM | POA: Diagnosis present

## 2019-11-20 DIAGNOSIS — S4991XA Unspecified injury of right shoulder and upper arm, initial encounter: Secondary | ICD-10-CM | POA: Diagnosis not present

## 2019-11-20 DIAGNOSIS — Y999 Unspecified external cause status: Secondary | ICD-10-CM | POA: Diagnosis not present

## 2019-11-20 DIAGNOSIS — Z79899 Other long term (current) drug therapy: Secondary | ICD-10-CM | POA: Diagnosis not present

## 2019-11-20 DIAGNOSIS — T07XXXA Unspecified multiple injuries, initial encounter: Secondary | ICD-10-CM

## 2019-11-20 DIAGNOSIS — R519 Headache, unspecified: Secondary | ICD-10-CM | POA: Diagnosis not present

## 2019-11-20 MED ORDER — CEPHALEXIN 500 MG PO CAPS: 500.0000 mg | ORAL_CAPSULE | Freq: Four times a day (QID) | ORAL | 0 refills | Status: AC

## 2019-11-20 MED ORDER — OXYCODONE-ACETAMINOPHEN 5-325 MG PO TABS: 1.0000 | ORAL_TABLET | Freq: Four times a day (QID) | ORAL | 0 refills | Status: AC | PRN

## 2019-11-20 MED ORDER — OXYCODONE-ACETAMINOPHEN 5-325 MG PO TABS
1.0000 | ORAL_TABLET | Freq: Once | ORAL | Status: AC
  Administered 2019-11-20: 1 via ORAL
  Filled 2019-11-20: qty 1
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
## 2019-11-20 NOTE — Progress Notes (Signed)
Orthopedic Tech Progress Note Patient Details:  Gabriel Boyd 04/21/1991 [id number]  Ortho Devices Type of Ortho Device: CAM walker, Short leg splint Ortho Device/Splint Location: RLE Ortho Device/Splint Interventions: Ordered, Adjustment, Application   Post Interventions Patient Tolerated: Well Instructions Provided: Care of device, Poper ambulation with device, Adjustment of device   Gabriel Boyd N Shravan Salahuddin 11/20/2019, 4:12 PM

## 2019-11-20 NOTE — Discharge Instructions (Addendum)
You may have diarrhea from the antibiotics.  It is very important that you continue to take the antibiotics even if you get diarrhea unless a medical professional tells you that you may stop taking them.  If you stop too early the bacteria you are being treated for will become stronger and you may need different, more powerful antibiotics that have more side effects and worsening diarrhea.  Please stay well hydrated and consider probiotics as they may decrease the severity of your diarrhea.   ° °Today you received medications that may make you sleepy or impair your ability to make decisions.  For the next 24 hours please do not drive, operate heavy machinery, care for a small child with out another adult present, or perform any activities that may cause harm to you or someone else if you were to fall asleep or be impaired.  ° °You are being prescribed a medication which may make you sleepy. Please follow up of listed precautions for at least 24 hours after taking one dose. ° °

## 2020-06-30 DEATH — deceased
